# Patient Record
Sex: Male | Born: 1985 | Race: White | Hispanic: Yes | Marital: Single | Smoking: Never smoker
Health system: Southern US, Community
[De-identification: ages and names within clinical notes are randomized; demographics above are authoritative.]

## PROBLEM LIST (undated history)

## (undated) DIAGNOSIS — E079 Disorder of thyroid, unspecified: Secondary | ICD-10-CM

## (undated) HISTORY — DX: Disorder of thyroid, unspecified: E07.9

---

## 2012-02-27 ENCOUNTER — Emergency Department: Payer: Self-pay | Admitting: Emergency Medicine

## 2014-03-22 ENCOUNTER — Ambulatory Visit: Payer: Self-pay | Admitting: Surgery

## 2014-12-17 NOTE — Op Note (Signed)
PATIENT NAME:  Lawrence Taylor, Lawrence Taylor MR#:  [id number] DATE OF BIRTH:  15-May-1986  DATE OF PROCEDURE:  03/23/2014  PREOPERATIVE DIAGNOSIS: Perianal impalement injury.  POSTOPERATIVE DIAGNOSES: Perianal impalement injury, superficial lacerations.   PROCEDURE PERFORMED: Examination under anesthesia of the anorectal canal, digital rectal examination, rigid proctoscopy to 15 cm.   SPECIMENS: None.   FINDINGS: There were superficial lacerations in the anterior midline and on the right anterior lateral portion of the perianal skin. There was no violation of the sphincter. There as normal tone. No evidence of mucosal injury proximal to or involving the dentate line. The prostate did not appear to be high riding or boggy. No soft tissue hematoma or active bleeding was noted.   DESCRIPTION OF PROCEDURE: With informed consent, supine position, general endotracheal anesthesia, the patient was then positioned in dorsal lithotomy. The perineum was sterilely prepped and draped with Betadine. Timeout was observed.   Digital rectal examination demonstrated normal tone. A duckbilled anoscope was introduced. Superficial lacerations were noted measuring approximately 1 to 1.5 cm in length. They are as noted above. There was no violation of the sphincter mechanism. Rigid proctoscopy was then performed after manual disimpaction. A rigid proctoscopy was then performed to approximately 20 cm and there was no evidence of violation of the rectal mucosa. At this point, a total of 10 mL of 0.25% plain Marcaine was then infiltrated with a clean needle and after reapplication of Betadine in a circumferential manner to the perianal skin and sphincter mechanism. Peripad was applied and the patient was then subsequently extubated and taken to the recovery room in stable and satisfactory condition by anesthesia services.     ____________________________ Redge GainerMark A. Egbert GaribaldiBird, MD mab:lt D: 03/23/2014 02:47:22 ET T: 03/23/2014  06:15:31 ET JOB#: [id number]  cc: Loraine LericheMark A. Egbert GaribaldiBird, MD, <Dictator> Karisa Nesser A Lyah Millirons MD ELECTRONICALLY SIGNED 03/23/2014 7:09

## 2014-12-17 NOTE — H&P (Signed)
   Subjective/Chief Complaint sat on knife at work by accident.   History of Present Illness 29 y.o male accidentally sat on knife while at workplace.  no blood in urine.   Past History thyroid disease.   Past Med/Surgical Hx:  Anxiety:   Hypothyroidism:   ALLERGIES:  No Known Allergies:    Other Allergies none   HOME MEDICATIONS: Medication Instructions Status  Synthroid  orally  Active   Family and Social History:  Family History Non-Contributory   Social History negative tobacco, negative ETOH   Place of Living Home   Review of Systems:  Subjective/Chief Complaint see above.   Sputum No   Abdominal Pain No   Tolerating Diet last ate and drank at 8 pm.   Medications/Allergies Reviewed Medications/Allergies reviewed   Physical Exam:  GEN no acute distress, thin, afvss   HEENT pale conjunctivae, PERRL   RESP normal resp effort  no use of accessory muscles   CARD regular rate   ABD soft  anterior midline perianal laceration, unable to fully exam secondary to pain., no active bleeding.   NEURO cranial nerves intact   PSYCH A+O to time, place, person, good insight    Assessment/Admission Diagnosis 29 y/o male with impalement injury to anorectum.   Plan exam under anesthesia, rigid proctoscopy, possible laparotomy and colostomy diversion based on significance of injury. also may need urological evaluation if injury extensive.   Electronic Signatures: Natale LayBird, Bria Sparr (MD)  (Signed 29-Jul-15 01:00)  Authored: CHIEF COMPLAINT and HISTORY, PAST MEDICAL/SURGIAL HISTORY, ALLERGIES, Other Allergies, HOME MEDICATIONS, FAMILY AND SOCIAL HISTORY, REVIEW OF SYSTEMS, PHYSICAL EXAM, ASSESSMENT AND PLAN   Last Updated: 29-Jul-15 01:00 by Natale LayBird, Duayne Brideau (MD)

## 2016-10-31 DIAGNOSIS — E039 Hypothyroidism, unspecified: Secondary | ICD-10-CM | POA: Diagnosis not present

## 2016-10-31 DIAGNOSIS — G4700 Insomnia, unspecified: Secondary | ICD-10-CM | POA: Diagnosis not present

## 2016-10-31 DIAGNOSIS — Z9852 Vasectomy status: Secondary | ICD-10-CM | POA: Diagnosis not present

## 2016-11-19 DIAGNOSIS — Z202 Contact with and (suspected) exposure to infections with a predominantly sexual mode of transmission: Secondary | ICD-10-CM | POA: Diagnosis not present

## 2016-11-19 DIAGNOSIS — E039 Hypothyroidism, unspecified: Secondary | ICD-10-CM | POA: Diagnosis not present

## 2016-11-19 DIAGNOSIS — Z0001 Encounter for general adult medical examination with abnormal findings: Secondary | ICD-10-CM | POA: Diagnosis not present

## 2016-11-20 ENCOUNTER — Ambulatory Visit: Payer: Self-pay | Admitting: Urology

## 2017-02-12 ENCOUNTER — Ambulatory Visit: Payer: BLUE CROSS/BLUE SHIELD | Admitting: Urology

## 2017-03-04 DIAGNOSIS — E039 Hypothyroidism, unspecified: Secondary | ICD-10-CM | POA: Diagnosis not present

## 2017-03-04 DIAGNOSIS — G4700 Insomnia, unspecified: Secondary | ICD-10-CM | POA: Diagnosis not present

## 2017-03-04 DIAGNOSIS — F1390 Sedative, hypnotic, or anxiolytic use, unspecified, uncomplicated: Secondary | ICD-10-CM | POA: Diagnosis not present

## 2017-03-09 NOTE — Progress Notes (Signed)
03/10/2017 11:33 AM   Lawrence Taylor 1986/01/21 [id number]  Referring provider: [facility] [id number] [place], [place] [address]  Chief Complaint  Patient presents with  . VAS Consult    new patient referred by [facility]    HPI: [name] is a 31 year old Hispanic male presents today as a referral from Vincent Gros, NP for a vasectomy.  Patient has 2 children, one son and one daughter, who desires no further biological children.  Patient denies any history of chronic prostatitis, epididymitis, orchitis, or other genital pain.  Today, we discussed what the vas deferens is, where it is located, and its function. We reviewed the procedure for vasectomy, it's risks, benefits, alternatives, and likelihood of achieving his goals.   We discussed in detail the procedure, complications, and recovery as well as the need for clearance prior to unprotected intercourse. We discussed that vasectomy does not protect against sexually transmitted diseases. We discussed that this procedure does not result in immediate sterility and that they would need to use other forms of birth control until he has been cleared with a three month negative postvasectomy semen analyses.  I explained that the procedure is considered to be permanent and that attempts at reversal have varying degrees of success. These options include vasectomy reversal, sperm retrieval, and in vitro fertilization; these can be very expensive.   We discussed the chance of postvasectomy pain syndrome which occurs in less than 5% of patients. I explained to the patient that there is no treatment to resolve this chronic pain, and that if it developed I would not be able to help resolve the issue, but that surgery is generally not needed for correction.   I explained there have even been reports of systemic like illness associated with this chronic pain, and that there was no good cure.  I explained that vasectomy it is not a 100% reliable form of birth control, and the risk of pregnancy after vasectomy is approximately 1 in 2000 men who had a negative postvasectomy semen analysis or rare non-motile sperm.  I explained that repeat vasectomy was necessary in less than 1% of vasectomy procedures when employing the type of technique that is performed in the office. I explained that he should refrain from ejaculation for approximately one week following vasectomy. I explained that there are other options for birth control which are permanent and non-permanent; we discussed these.  I explained the rates of surgical complications, such as symptomatic hematoma or infection, are low (1-2%) and vary with the surgeon's experience and criteria used to diagnose the complication.   PMH: No past medical history on file.  Surgical History: History reviewed. No pertinent surgical history.  Home Medications:  Allergies as of 03/10/2017   No Known Allergies     Medication List       Accurate as of 03/10/17 11:33 AM. Always use your most recent med list.          clindamycin 300 MG capsule Commonly known as:  CLEOCIN Take 300 mg by mouth 3 (three) times daily.   clonazePAM 0.5 MG tablet Commonly known as:  KLONOPIN Take 0.5 mg by mouth 2 (two) times daily as needed for anxiety.   diazepam 10 MG tablet Commonly known as:  VALIUM Take 1 tablet (10 mg total) by mouth once.   levothyroxine 50 MCG tablet Commonly known as:  SYNTHROID, LEVOTHROID Take 50 mcg by mouth daily before breakfast.  Allergies: No Known Allergies  Family History: Family History  Problem Relation Age of Onset  . Kidney cancer Neg Hx   . Prostate cancer Neg Hx   . Bladder Cancer Neg Hx     Social History:  reports that he has never smoked. He has never used smokeless tobacco. He reports that he drinks alcohol. He reports that he does not use drugs.  ROS: UROLOGY Frequent Urination?: No Hard  to postpone urination?: No Burning/pain with urination?: No Get up at night to urinate?: No Leakage of urine?: No Urine stream starts and stops?: No Trouble starting stream?: No Do you have to strain to urinate?: No Blood in urine?: No Urinary tract infection?: No Sexually transmitted disease?: No Injury to kidneys or bladder?: No Painful intercourse?: No Weak stream?: No Erection problems?: No Penile pain?: No  Gastrointestinal Nausea?: No Vomiting?: No Indigestion/heartburn?: No Diarrhea?: Yes Constipation?: No  Constitutional Fever: No Night sweats?: No Weight loss?: No Fatigue?: No  Skin Skin rash/lesions?: No Itching?: No  Eyes Blurred vision?: No Double vision?: No  Ears/Nose/Throat Sore throat?: No Sinus problems?: No  Hematologic/Lymphatic Swollen glands?: No Easy bruising?: No  Cardiovascular Leg swelling?: No Chest pain?: No  Respiratory Cough?: No Shortness of breath?: No  Endocrine Excessive thirst?: No  Musculoskeletal Back pain?: Yes Joint pain?: No  Neurological Headaches?: No Dizziness?: No  Psychologic Depression?: No Anxiety?: Yes  Physical Exam: BP 124/76   Pulse (!) 51   Ht 5\' 9"  (1.753 m)   Wt 178 lb 8 oz (81 kg)   BMI 26.36 kg/m   Constitutional: Well nourished. Alert and oriented, No acute distress. HEENT: [place] AT, moist mucus membranes. Trachea midline, no masses. Cardiovascular: No clubbing, cyanosis, or edema. Respiratory: Normal respiratory effort, no increased work of breathing. GI: Abdomen is soft, non tender, non distended, no abdominal masses. Liver and spleen not palpable.  No hernias appreciated.  Stool sample for occult testing is not indicated.   GU: No CVA tenderness.  No bladder fullness or masses.  Patient with uncircumcised phallus.  Foreskin easily retracted.  Urethral meatus is patent.  No penile discharge. No penile lesions or rashes. Scrotum without lesions, cysts, rashes and/or edema.   Testicles are located scrotally bilaterally. No masses are appreciated in the testicles. Left and right epididymis are normal. Rectal: Deferred.   Skin: No rashes, bruises or suspicious lesions. Lymph: No cervical or inguinal adenopathy. Neurologic: Grossly intact, no focal deficits, moving all 4 extremities. Psychiatric: Normal mood and affect.   Assessment & Plan:     1. Vasectomy consult:  Patient has read and signed the consent.  He is given the pre-op vasectomy instruction sheet.  He is prescribed Valium 10 mg and instructed to take it 30 minutes prior to his vasectomy appointment.  He is to have a driver.  I reemphasized to the patient that this is to be considered a permanent form of birth control, that he is to use an alternative form of birth control until we receive the 3 months specimen and it is cleared of sperm and that this will not prevent STI's.  His questions are answered to his satisfaction and he understands the risks and is willing to proceed with the vasectomy.  He will schedule his vasectomy.    I spent 30 minutes in a face-to-face conversation concerning the vasectomy procedure and pre-and post op expectations.  Greater than 50% was spent in counseling & coordination of care with the patient.   Return for vasectomy.  These notes generated with voice recognition software. I apologize for typographical errors.  Zara Council, [facility] [address], [place], [place] [address] [phone]

## 2017-03-10 ENCOUNTER — Ambulatory Visit (INDEPENDENT_AMBULATORY_CARE_PROVIDER_SITE_OTHER): Payer: BLUE CROSS/BLUE SHIELD | Admitting: Urology

## 2017-03-10 ENCOUNTER — Encounter: Payer: Self-pay | Admitting: Urology

## 2017-03-10 ENCOUNTER — Telehealth: Payer: Self-pay | Admitting: Urology

## 2017-03-10 VITALS — BP 124/76 | HR 51 | Ht 69.0 in | Wt 178.5 lb

## 2017-03-10 DIAGNOSIS — Z3009 Encounter for other general counseling and advice on contraception: Secondary | ICD-10-CM | POA: Diagnosis not present

## 2017-03-10 DIAGNOSIS — F411 Generalized anxiety disorder: Secondary | ICD-10-CM

## 2017-03-10 MED ORDER — DIAZEPAM 10 MG PO TABS: 10.0000 mg | ORAL_TABLET | Freq: Once | ORAL | 0 refills | Status: AC

## 2017-03-10 NOTE — Telephone Encounter (Signed)
Would you send my note to Vincent GrosHeather Boscia, [facility]?

## 2017-03-11 NOTE — Telephone Encounter (Signed)
done

## 2017-07-03 DIAGNOSIS — E039 Hypothyroidism, unspecified: Secondary | ICD-10-CM | POA: Diagnosis not present

## 2017-07-03 DIAGNOSIS — G4700 Insomnia, unspecified: Secondary | ICD-10-CM | POA: Diagnosis not present

## 2017-10-31 ENCOUNTER — Ambulatory Visit (INDEPENDENT_AMBULATORY_CARE_PROVIDER_SITE_OTHER): Payer: BLUE CROSS/BLUE SHIELD | Admitting: Nurse Practitioner

## 2017-10-31 ENCOUNTER — Encounter: Payer: Self-pay | Admitting: Nurse Practitioner

## 2017-10-31 VITALS — BP 114/80 | HR 66 | Resp 16 | Ht 68.0 in | Wt 174.2 lb

## 2017-10-31 DIAGNOSIS — F411 Generalized anxiety disorder: Secondary | ICD-10-CM | POA: Diagnosis not present

## 2017-10-31 DIAGNOSIS — E039 Hypothyroidism, unspecified: Secondary | ICD-10-CM | POA: Diagnosis not present

## 2017-10-31 MED ORDER — LEVOTHYROXINE SODIUM 50 MCG PO TABS: 50.0000 ug | ORAL_TABLET | Freq: Every day | ORAL | 3 refills | Status: DC

## 2017-10-31 MED ORDER — CLONAZEPAM 0.5 MG PO TABS: 0.5000 mg | ORAL_TABLET | Freq: Every day | ORAL | 3 refills | Status: DC

## 2017-10-31 NOTE — Progress Notes (Signed)
[facility] [address] [place], [place] [address]  Internal MEDICINE  Office Visit Note  Patient Name: Lawrence Taylor  [id number]  [id number]  Date of Service: 11/22/2017  No chief complaint on file.   The patient is here for routine follow up exam. He continues to use clonazepam 0.5mg  at bedtime to help him sleep. He is able to  Take this medication at bedtime and it enables him to fall asleep and stay sleep without issue. He needs to have this medication filled today.  He has no new concerns or complaints today.    Pt is here for routine follow up.    Current Medication: Outpatient Encounter Medications as of 10/31/2017  Medication Sig  . clonazePAM (KLONOPIN) 0.5 MG tablet Take 1 tablet (0.5 mg total) by mouth at bedtime.  Marland Kitchen levothyroxine (SYNTHROID, LEVOTHROID) 50 MCG tablet Take 1 tablet (50 mcg total) by mouth daily before breakfast.  . [DISCONTINUED] clonazePAM (KLONOPIN) 0.5 MG tablet Take 0.5 mg by mouth 2 (two) times daily as needed for anxiety.  . [DISCONTINUED] levothyroxine (SYNTHROID, LEVOTHROID) 50 MCG tablet Take 50 mcg by mouth daily before breakfast.  . [DISCONTINUED] clindamycin (CLEOCIN) 300 MG capsule Take 300 mg by mouth 3 (three) times daily.   No facility-administered encounter medications on file as of 10/31/2017.     Surgical History: No past surgical history on file.  Medical History: Past Medical History:  Diagnosis Date  . Thyroid disease     Family History: Family History  Problem Relation Age of Onset  . Kidney cancer Neg Hx   . Prostate cancer Neg Hx   . Bladder Cancer Neg Hx     Social History   Socioeconomic History  . Marital status: Single    Spouse name: Not on file  . Number of children: Not on file  . Years of education: Not on file  . Highest education level: Not on file  Occupational History  . Not on file  Social Needs  . Financial resource strain: Not on file  . Food insecurity:    Worry:  Not on file    Inability: Not on file  . Transportation needs:    Medical: Not on file    Non-medical: Not on file  Tobacco Use  . Smoking status: Never Smoker  . Smokeless tobacco: Never Used  Substance and Sexual Activity  . Alcohol use: Yes    Comment: rare  . Drug use: No  . Sexual activity: Not on file  Lifestyle  . Physical activity:    Days per week: Not on file    Minutes per session: Not on file  . Stress: Not on file  Relationships  . Social connections:    Talks on phone: Not on file    Gets together: Not on file    Attends religious service: Not on file    Active member of club or organization: Not on file    Attends meetings of clubs or organizations: Not on file    Relationship status: Not on file  . Intimate partner violence:    Fear of current or ex partner: Not on file    Emotionally abused: Not on file    Physically abused: Not on file    Forced sexual activity: Not on file  Other Topics Concern  . Not on file  Social History Narrative  . Not on file      Review of Systems  Constitutional: Negative for activity change, chills, fatigue  and unexpected weight change.  HENT: Negative for congestion, postnasal drip, rhinorrhea, sneezing and sore throat.   Eyes: Negative.  Negative for redness.  Respiratory: Negative for cough, chest tightness, shortness of breath and wheezing.   Cardiovascular: Negative for chest pain and palpitations.  Gastrointestinal: Negative for abdominal pain, constipation, diarrhea, nausea and vomiting.  Endocrine:       Thyroid levels have been well controlled.   Genitourinary: Negative.  Negative for dysuria and frequency.  Musculoskeletal: Negative for arthralgias, back pain, joint swelling and neck pain.  Skin: Negative for rash.  Allergic/Immunologic: Negative for environmental allergies.  Neurological: Negative for tremors, numbness and headaches.  Hematological: Negative for adenopathy. Does not bruise/bleed easily.   Psychiatric/Behavioral: Positive for sleep disturbance. Negative for behavioral problems (Depression) and suicidal ideas. The patient is nervous/anxious.     Vital Signs: BP 114/80   Pulse 66   Resp 16   Ht 5\' 8"  (1.727 m)   Wt 174 lb 3.2 oz (79 kg)   SpO2 98%   BMI 26.49 kg/m    Physical Exam  Constitutional: He is oriented to person, place, and time. He appears well-developed and well-nourished. No distress.  HENT:  Head: Normocephalic and atraumatic.  Mouth/Throat: Oropharynx is clear and moist. No oropharyngeal exudate.  Eyes: Pupils are equal, round, and reactive to light. EOM are normal.  Neck: Normal range of motion. Neck supple. No JVD present. No tracheal deviation present. No thyromegaly present.  Cardiovascular: Normal rate, regular rhythm and normal heart sounds. Exam reveals no gallop and no friction rub.  No murmur heard. Pulmonary/Chest: Effort normal and breath sounds normal. No respiratory distress. He has no wheezes. He has no rales. He exhibits no tenderness.  Abdominal: Soft. Bowel sounds are normal. There is no tenderness.  Musculoskeletal: Normal range of motion.  Lymphadenopathy:    He has no cervical adenopathy.  Neurological: He is alert and oriented to person, place, and time. No cranial nerve deficit.  Skin: Skin is warm and dry. He is not diaphoretic.  Psychiatric: He has a normal mood and affect. His behavior is normal. Judgment and thought content normal.  Nursing note and vitals reviewed.  Assessment/Plan: 1. Acquired hypothyroidism Thyroid panel within normal limits. contineu levothyroxine as prescribed.  - levothyroxine (SYNTHROID, LEVOTHROID) 50 MCG tablet; Take 1 tablet (50 mcg total) by mouth daily before breakfast.  Dispense: 30 tablet; Refill: 3  2. Generalized anxiety disorder May continue clonazepam 0.5mg  at night as needed for insomnia. New prescription provided to pharmacy.  - clonazePAM (KLONOPIN) 0.5 MG tablet; Take 1 tablet (0.5  mg total) by mouth at bedtime.  Dispense: 30 tablet; Refill: 3  General Counseling: Dat verbalizes understanding of the findings of todays visit and agrees with plan of treatment. I have discussed any further diagnostic evaluation that may be needed or ordered today. We also reviewed his medications today. he has been encouraged to call the office with any questions or concerns that should arise related to todays visit.  This patient was seen by Vincent Gros, FNP- C in Collaboration with Dr Lyndon Code as a part of collaborative care agreement   Meds ordered this encounter  Medications  . clonazePAM (KLONOPIN) 0.5 MG tablet    Sig: Take 1 tablet (0.5 mg total) by mouth at bedtime.    Dispense:  30 tablet    Refill:  3    Order Specific Question:   Supervising Provider    Answer:   Lyndon Code [[id number]]  .  levothyroxine (SYNTHROID, LEVOTHROID) 50 MCG tablet    Sig: Take 1 tablet (50 mcg total) by mouth daily before breakfast.    Dispense:  30 tablet    Refill:  3    Order Specific Question:   Supervising Provider    Answer:   Lyndon CodeKHAN, FOZIA M [[id number]]    Time spent: 6115 Minutes     Dr Lyndon CodeFozia M Khan Internal medicine

## 2017-11-22 DIAGNOSIS — F411 Generalized anxiety disorder: Secondary | ICD-10-CM | POA: Insufficient documentation

## 2017-11-22 DIAGNOSIS — E039 Hypothyroidism, unspecified: Secondary | ICD-10-CM | POA: Insufficient documentation

## 2018-01-21 ENCOUNTER — Emergency Department
Admission: EM | Admit: 2018-01-21 | Discharge: 2018-01-21 | Disposition: A | Discharge status: Home / Self Care | Payer: BLUE CROSS/BLUE SHIELD | Attending: Emergency Medicine | Admitting: Emergency Medicine

## 2018-01-21 ENCOUNTER — Other Ambulatory Visit: Payer: Self-pay

## 2018-01-21 ENCOUNTER — Encounter: Payer: Self-pay | Admitting: Emergency Medicine

## 2018-01-21 DIAGNOSIS — K219 Gastro-esophageal reflux disease without esophagitis: Secondary | ICD-10-CM

## 2018-01-21 DIAGNOSIS — Z79899 Other long term (current) drug therapy: Secondary | ICD-10-CM | POA: Diagnosis not present

## 2018-01-21 DIAGNOSIS — R066 Hiccough: Secondary | ICD-10-CM

## 2018-01-21 MED ORDER — GI COCKTAIL ~~LOC~~
30.0000 mL | Freq: Once | ORAL | Status: AC
  Administered 2018-01-21: 30 mL via ORAL
  Filled 2018-01-21: qty 30

## 2018-01-21 MED ORDER — OMEPRAZOLE 10 MG PO CPDR: 10.0000 mg | DELAYED_RELEASE_CAPSULE | Freq: Every day | ORAL | 0 refills | Status: DC

## 2018-01-21 NOTE — ED Provider Notes (Signed)
[facility] Emergency Department Provider Note  ____________________________________________  Time seen: Approximately 11:23 PM  I have reviewed the triage vital signs and the nursing notes.   HISTORY  Chief Complaint No chief complaint on file.    HPI Lawrence Taylor is a 32 y.o. male who presents the emergency department complaining of ongoing hiccups.  Patient reports that he typically eats spicy foods with no difficulty, however he ate a large burrito with significant spicy components.  Patient reports since then he has had GERD-like symptoms with bulging, burning and esophageal tract.  Patient is also had intermittent hiccups lasting for 24 hours.  Patient denies any difficulty breathing or swallowing.  No shortness of breath or chest pain.  No history of GERD.  Patient is tried multiple home remedies without successful relief of hiccups or GERD symptoms.  No other complaints.  Past Medical History:  Diagnosis Date  . Thyroid disease     Patient Active Problem List   Diagnosis Date Noted  . Acquired hypothyroidism 11/22/2017  . Generalized anxiety disorder 11/22/2017    History reviewed. No pertinent surgical history.  Prior to Admission medications   Medication Sig Start Date End Date Taking? Authorizing Provider  clonazePAM (KLONOPIN) 0.5 MG tablet Take 1 tablet (0.5 mg total) by mouth at bedtime. 10/31/17   Carlean Jews, NP  levothyroxine (SYNTHROID, LEVOTHROID) 50 MCG tablet Take 1 tablet (50 mcg total) by mouth daily before breakfast. 10/31/17   Carlean Jews, NP  omeprazole (PRILOSEC) 10 MG capsule Take 1 capsule (10 mg total) by mouth daily. 01/21/18   Morine Kohlman, Delorise Royals, PA-C    Allergies Patient has no known allergies.  Family History  Problem Relation Age of Onset  . Kidney cancer Neg Hx   . Prostate cancer Neg Hx   . Bladder Cancer Neg Hx     Social History Social History   Tobacco Use  . Smoking status:  Never Smoker  . Smokeless tobacco: Never Used  Substance Use Topics  . Alcohol use: Yes    Comment: rare  . Drug use: No     Review of Systems  Constitutional: No fever/chills Eyes: No visual changes. No discharge ENT: No upper respiratory complaints. Cardiovascular: no chest pain. Respiratory: no cough. No SOB. Gastrointestinal: Reflux symptoms with hiccuping.  No abdominal pain.  No nausea, no vomiting.  No diarrhea.  No constipation. Musculoskeletal: Negative for musculoskeletal pain. Skin: Negative for rash, abrasions, lacerations, ecchymosis. Neurological: Negative for headaches, focal weakness or numbness. 10-point ROS otherwise negative.  ____________________________________________   PHYSICAL EXAM:  VITAL SIGNS: ED Triage Vitals [01/21/18 2203]  Enc Vitals Group     BP (!) 142/80     Pulse Rate 83     Resp 17     Temp 98.4 F (36.9 C)     Temp Source Oral     SpO2 97 %     Weight 175 lb (79.4 kg)     Height  (1.727 m)     Head Circumference      Peak Flow      Pain Score 0     Pain Loc      Pain Edu?      Excl. in GC?      Constitutional: Alert and oriented. Well appearing and in no acute distress. Eyes: Conjunctivae are normal. PERRL. EOMI. Head: Atraumatic. ENT:      Ears:       Nose: No congestion/rhinnorhea.  Mouth/Throat: Mucous membranes are moist.  Pharynx is nonerythematous and nonedematous. Neck: No stridor.    Cardiovascular: Normal rate, regular rhythm. Normal S1 and S2.  Good peripheral circulation. Respiratory: Normal respiratory effort without tachypnea or retractions. Lungs CTAB. Good air entry to the bases with no decreased or absent breath sounds. Gastrointestinal: Ongoing hiccuping.  Bowel sounds 4 quadrants. Soft and nontender to palpation. No guarding or rigidity. No palpable masses. No distention. No CVA tenderness. Musculoskeletal: Full range of motion to all extremities. No gross deformities appreciated. Neurologic:   Normal speech and language. No gross focal neurologic deficits are appreciated.  Skin:  Skin is warm, dry and intact. No rash noted. Psychiatric: Mood and affect are normal. Speech and behavior are normal. Patient exhibits appropriate insight and judgement.   ____________________________________________   LABS (all labs ordered are listed, but only abnormal results are displayed)  Labs Reviewed - No data to display ____________________________________________  EKG   ____________________________________________  RADIOLOGY   No results found.  ____________________________________________    PROCEDURES  Procedure(s) performed:    Procedures    Medications  gi cocktail (Maalox,Lidocaine,Donnatal) (30 mLs Oral Given 01/21/18 2338)     ____________________________________________   INITIAL IMPRESSION / ASSESSMENT AND PLAN / ED COURSE  Pertinent labs & imaging results that were available during my care of the patient were reviewed by me and considered in my medical decision making (see chart for details).  Review of the [place] CSRS was performed in accordance of the [facility] prior to dispensing any controlled drugs.     Patient's diagnosis is consistent with hiccups with gastric reflux.  Patient presents with 24-hour history of intermittent hiccups with GERD symptoms.  Patient does not have a history of GERD.  Patient ate a spicy burrito prior to onset of symptoms.  Exam was reassuring with no acute findings.  I feel that symptoms are most likely contributory to spicy foods causing reflux and hiccups.  Patient is treated with GI cocktail for symptom relief.  No indication for labs or imaging at this time. Patient will be discharged home with prescriptions for omeprazole. Patient is to follow up with primary care as needed or otherwise directed. Patient is given ED precautions to return to the ED for any worsening or new  symptoms.     ____________________________________________  FINAL CLINICAL IMPRESSION(S) / ED DIAGNOSES  Final diagnoses:  Hiccups  Gastric reflux      NEW MEDICATIONS STARTED DURING THIS VISIT:  ED Discharge Orders        Ordered    omeprazole (PRILOSEC) 10 MG capsule  Daily     01/21/18 2333          This chart was dictated using voice recognition software/Dragon. Despite best efforts to proofread, errors can occur which can change the meaning. Any change was purely unintentional.    Racheal Patches, PA-C 01/22/18 0050    Sharyn Creamer, MD 01/23/18 [phone]

## 2018-01-21 NOTE — ED Triage Notes (Signed)
Patient states that hiccups started at 10pm yesterday and wasn't able to sleep last night. Patient states when he has the hiccups he also has GERD like symptoms. Patient has no hiccups or heartburn at the moment.

## 2018-01-25 DIAGNOSIS — R112 Nausea with vomiting, unspecified: Secondary | ICD-10-CM | POA: Diagnosis not present

## 2018-01-25 DIAGNOSIS — R066 Hiccough: Secondary | ICD-10-CM | POA: Diagnosis not present

## 2018-01-25 DIAGNOSIS — K219 Gastro-esophageal reflux disease without esophagitis: Secondary | ICD-10-CM | POA: Diagnosis not present

## 2018-02-27 ENCOUNTER — Encounter: Payer: Self-pay | Admitting: Nurse Practitioner

## 2018-02-27 ENCOUNTER — Ambulatory Visit: Payer: BLUE CROSS/BLUE SHIELD | Admitting: Nurse Practitioner

## 2018-02-27 ENCOUNTER — Encounter (INDEPENDENT_AMBULATORY_CARE_PROVIDER_SITE_OTHER): Payer: Self-pay

## 2018-02-27 ENCOUNTER — Ambulatory Visit: Payer: Self-pay | Admitting: Nurse Practitioner

## 2018-02-27 VITALS — BP 114/68 | HR 83 | Resp 16 | Ht 68.0 in | Wt 167.0 lb

## 2018-02-27 DIAGNOSIS — Z79899 Other long term (current) drug therapy: Secondary | ICD-10-CM | POA: Diagnosis not present

## 2018-02-27 DIAGNOSIS — R0789 Other chest pain: Secondary | ICD-10-CM

## 2018-02-27 DIAGNOSIS — E039 Hypothyroidism, unspecified: Secondary | ICD-10-CM

## 2018-02-27 DIAGNOSIS — R071 Chest pain on breathing: Secondary | ICD-10-CM

## 2018-02-27 DIAGNOSIS — F411 Generalized anxiety disorder: Secondary | ICD-10-CM

## 2018-02-27 DIAGNOSIS — R079 Chest pain, unspecified: Secondary | ICD-10-CM

## 2018-02-27 LAB — POCT URINE DRUG SCREEN
POC Amphetamine UR: NOT DETECTED
POC BENZODIAZEPINES UR: NOT DETECTED
POC Barbiturate UR: NOT DETECTED
POC COCAINE UR: NOT DETECTED
POC Ecstasy UR: NOT DETECTED
POC Marijuana UR: NOT DETECTED
POC Methadone UR: NOT DETECTED
POC Methamphetamine UR: NOT DETECTED
POC Opiate Ur: NOT DETECTED
POC Oxycodone UR: NOT DETECTED
POC PHENCYCLIDINE UR: NOT DETECTED
POC TRICYCLICS UR: NOT DETECTED

## 2018-02-27 MED ORDER — CLONAZEPAM 0.5 MG PO TABS: 0.5000 mg | ORAL_TABLET | Freq: Every day | ORAL | 3 refills | Status: DC

## 2018-02-27 MED ORDER — LEVOTHYROXINE SODIUM 50 MCG PO TABS: 50.0000 ug | ORAL_TABLET | Freq: Every day | ORAL | 4 refills | Status: DC

## 2018-02-27 NOTE — Progress Notes (Signed)
[facility] [address] [place], [place] [address]  Internal MEDICINE  Office Visit Note  Patient Name: Lawrence Taylor  [id number]  [id number]  Date of Service: 02/27/2018  Chief Complaint  Patient presents with  . Chest Pain    The patient states that he had hiccups and chest pain for about 8 days. He was seen in ER at [facility]. Was given rx for omeprazole. Still had chest pain and hiccups after that.. Did got to ER again at [facility]. Again, no ECG was done. Was given different medicine for reflux. Did better after this visit and chest pain/hiccups eventually resolved. Her still has some reproducible pain in left chest at the third intercostal space. Hurts mre when he bends over at the waist and dangles his arm. No shortness of breath or other worrisome symptoms are reported.    Pt is here for routine follow up.    Current Medication: Outpatient Encounter Medications as of 02/27/2018  Medication Sig  . clonazePAM (KLONOPIN) 0.5 MG tablet Take 1 tablet (0.5 mg total) by mouth at bedtime.  Marland Kitchen levothyroxine (SYNTHROID, LEVOTHROID) 50 MCG tablet Take 1 tablet (50 mcg total) by mouth daily before breakfast.  . omeprazole (PRILOSEC) 10 MG capsule Take 1 capsule (10 mg total) by mouth daily.  . [DISCONTINUED] clonazePAM (KLONOPIN) 0.5 MG tablet Take 1 tablet (0.5 mg total) by mouth at bedtime.  . [DISCONTINUED] levothyroxine (SYNTHROID, LEVOTHROID) 50 MCG tablet Take 1 tablet (50 mcg total) by mouth daily before breakfast.   No facility-administered encounter medications on file as of 02/27/2018.     Surgical History: History reviewed. No pertinent surgical history.  Medical History: Past Medical History:  Diagnosis Date  . Thyroid disease     Family History: Family History  Problem Relation Age of Onset  . Kidney cancer Neg Hx   . Prostate cancer Neg Hx   . Bladder Cancer Neg Hx     Social History   Socioeconomic History  . Marital status:  Single    Spouse name: Not on file  . Number of children: Not on file  . Years of education: Not on file  . Highest education level: Not on file  Occupational History  . Not on file  Social Needs  . Financial resource strain: Not on file  . Food insecurity:    Worry: Not on file    Inability: Not on file  . Transportation needs:    Medical: Not on file    Non-medical: Not on file  Tobacco Use  . Smoking status: Never Smoker  . Smokeless tobacco: Never Used  Substance and Sexual Activity  . Alcohol use: Yes    Comment: rare  . Drug use: No  . Sexual activity: Not on file  Lifestyle  . Physical activity:    Days per week: Not on file    Minutes per session: Not on file  . Stress: Not on file  Relationships  . Social connections:    Talks on phone: Not on file    Gets together: Not on file    Attends religious service: Not on file    Active member of club or organization: Not on file    Attends meetings of clubs or organizations: Not on file    Relationship status: Not on file  . Intimate partner violence:    Fear of current or ex partner: Not on file    Emotionally abused: Not on file    Physically abused:  Not on file    Forced sexual activity: Not on file  Other Topics Concern  . Not on file  Social History Narrative  . Not on file      Review of Systems  Constitutional: Negative for activity change, chills, fatigue and unexpected weight change.  HENT: Negative for congestion, postnasal drip, rhinorrhea, sneezing and sore throat.   Eyes: Negative.  Negative for redness.  Respiratory: Negative for cough, chest tightness, shortness of breath and wheezing.   Cardiovascular: Positive for chest pain. Negative for palpitations.  Gastrointestinal: Negative for abdominal pain, constipation, diarrhea, nausea and vomiting.  Endocrine:       Thyroid levels have been well controlled.   Genitourinary: Negative.  Negative for dysuria and frequency.  Musculoskeletal:  Negative for arthralgias, back pain, joint swelling and neck pain.  Skin: Negative for rash.  Allergic/Immunologic: Negative for environmental allergies.  Neurological: Negative for tremors, numbness and headaches.  Hematological: Negative for adenopathy. Does not bruise/bleed easily.  Psychiatric/Behavioral: Positive for sleep disturbance. Negative for behavioral problems (Depression) and suicidal ideas. The patient is nervous/anxious.     Today's Vitals   02/27/18 1426  BP: 114/68  Pulse: 83  Resp: 16  SpO2: 96%  Weight: 167 lb (75.8 kg)  Height: 5\' 8"  (1.727 m)    Physical Exam  Constitutional: He is oriented to person, place, and time. He appears well-developed and well-nourished. No distress.  HENT:  Head: Normocephalic and atraumatic.  Mouth/Throat: Oropharynx is clear and moist. No oropharyngeal exudate.  Eyes: Pupils are equal, round, and reactive to light. EOM are normal.  Neck: Normal range of motion. Neck supple. No JVD present. No tracheal deviation present. No thyromegaly present.  Cardiovascular: Normal rate, regular rhythm, normal heart sounds and normal pulses. Exam reveals no gallop and no friction rub.  No murmur heard. ECG done today is essentially within normal limits.   Pulmonary/Chest: Effort normal and breath sounds normal. No respiratory distress. He has no wheezes. He has no rales. He exhibits no tenderness.  Abdominal: Soft. Bowel sounds are normal. There is no tenderness.  Musculoskeletal: Normal range of motion.  reproducible chest discomfort at left chest, 4th intercostal space. No swelling or deformity is palpated.   Lymphadenopathy:    He has no cervical adenopathy.  Neurological: He is alert and oriented to person, place, and time. No cranial nerve deficit.  Skin: Skin is warm and dry. He is not diaphoretic.  Psychiatric: His behavior is normal. Judgment and thought content normal. His mood appears anxious.  Nursing note and vitals  reviewed.  Assessment/Plan: 1. Chest pain, unspecified type - EKG 12-Lead today is eesentially within normal limits.   2. Costochondral chest pain Likely cause of chest pain. Advised NSAID treatment as needed and low heat in 15 to 20 minute intervals   3. Acquired hypothyroidism Asked to have 90 day prescription of levothyroxine sent to pharmacy.  - levothyroxine (SYNTHROID, LEVOTHROID) 50 MCG tablet; Take 1 tablet (50 mcg total) by mouth daily before breakfast.  Dispense: 90 tablet; Refill: 4  4. Generalized anxiety disorder Take clonazepam 0.5mg  at bedtime as needed. New rx sent today.  - clonazePAM (KLONOPIN) 0.5 MG tablet; Take 1 tablet (0.5 mg total) by mouth at bedtime.  Dispense: 30 tablet; Refill: 3  5. Encounter for long-term (current) use of medications uds negative for all controlled medication.  - POCT Urine Drug Screen  General Counseling: Lawrence Taylor verbalizes understanding of the findings of todays visit and agrees with plan of treatment. I  have discussed any further diagnostic evaluation that may be needed or ordered today. We also reviewed his medications today. he has been encouraged to call the office with any questions or concerns that should arise related to todays visit.    Counseling:  This patient was seen by Vincent Gros, FNP- C in Collaboration with Dr Lyndon Code as a part of collaborative care agreement   Orders Placed This Encounter  Procedures  . POCT Urine Drug Screen  . EKG 12-Lead    Meds ordered this encounter  Medications  . clonazePAM (KLONOPIN) 0.5 MG tablet    Sig: Take 1 tablet (0.5 mg total) by mouth at bedtime.    Dispense:  30 tablet    Refill:  3    Order Specific Question:   Supervising Provider    Answer:   Lyndon Code [[id number]]  . levothyroxine (SYNTHROID, LEVOTHROID) 50 MCG tablet    Sig: Take 1 tablet (50 mcg total) by mouth daily before breakfast.    Dispense:  90 tablet    Refill:  4    Please fill as ninety day  prescription.    Order Specific Question:   Supervising Provider    Answer:   Lyndon Code [[id number]]    Time spent: 32 Minutes     Dr Lyndon Code Internal medicine

## 2018-07-06 ENCOUNTER — Encounter: Payer: Self-pay | Admitting: Nurse Practitioner

## 2018-07-06 ENCOUNTER — Ambulatory Visit (INDEPENDENT_AMBULATORY_CARE_PROVIDER_SITE_OTHER): Payer: BLUE CROSS/BLUE SHIELD | Admitting: Nurse Practitioner

## 2018-07-06 VITALS — BP 124/78 | HR 75 | Resp 16 | Ht 68.0 in | Wt 172.0 lb

## 2018-07-06 DIAGNOSIS — F411 Generalized anxiety disorder: Secondary | ICD-10-CM | POA: Diagnosis not present

## 2018-07-06 DIAGNOSIS — E039 Hypothyroidism, unspecified: Secondary | ICD-10-CM | POA: Diagnosis not present

## 2018-07-06 MED ORDER — CLONAZEPAM 0.5 MG PO TABS: 0.5000 mg | ORAL_TABLET | Freq: Every day | ORAL | 3 refills | Status: DC

## 2018-07-06 MED ORDER — LEVOTHYROXINE SODIUM 50 MCG PO TABS: 50.0000 ug | ORAL_TABLET | Freq: Every day | ORAL | 3 refills | Status: DC

## 2018-07-06 NOTE — Progress Notes (Signed)
[facility] [address] [place], [place] [address]  Internal MEDICINE  Office Visit Note  Patient Name: Lawrence Taylor  [id number]  [id number]  Date of Service: 07/08/2018  Chief Complaint  Patient presents with  . Medical Management of Chronic Issues    7month follow up    The patient is here for routine follow up exam. He continues to use clonazepam 0.5mg  at bedtime to help him sleep. He is able to take this medication at bedtime and it enables him to fall asleep and stay sleep without issue. He needs to have this medication filled today.  He has no new concerns or complaints today.       Current Medication: Outpatient Encounter Medications as of 07/06/2018  Medication Sig  . clonazePAM (KLONOPIN) 0.5 MG tablet Take 1 tablet (0.5 mg total) by mouth at bedtime.  Marland Kitchen levothyroxine (SYNTHROID, LEVOTHROID) 50 MCG tablet Take 1 tablet (50 mcg total) by mouth daily before breakfast.  . omeprazole (PRILOSEC) 10 MG capsule Take 1 capsule (10 mg total) by mouth daily.  . [DISCONTINUED] clonazePAM (KLONOPIN) 0.5 MG tablet Take 1 tablet (0.5 mg total) by mouth at bedtime.  . [DISCONTINUED] levothyroxine (SYNTHROID, LEVOTHROID) 50 MCG tablet Take 1 tablet (50 mcg total) by mouth daily before breakfast.   No facility-administered encounter medications on file as of 07/06/2018.     Surgical History: History reviewed. No pertinent surgical history.  Medical History: Past Medical History:  Diagnosis Date  . Thyroid disease     Family History: Family History  Problem Relation Age of Onset  . Kidney cancer Neg Hx   . Prostate cancer Neg Hx   . Bladder Cancer Neg Hx     Social History   Socioeconomic History  . Marital status: Single    Spouse name: Not on file  . Number of children: Not on file  . Years of education: Not on file  . Highest education level: Not on file  Occupational History  . Not on file  Social Needs  . Financial resource  strain: Not on file  . Food insecurity:    Worry: Not on file    Inability: Not on file  . Transportation needs:    Medical: Not on file    Non-medical: Not on file  Tobacco Use  . Smoking status: Never Smoker  . Smokeless tobacco: Never Used  Substance and Sexual Activity  . Alcohol use: Yes    Comment: rare  . Drug use: No  . Sexual activity: Not on file  Lifestyle  . Physical activity:    Days per week: Not on file    Minutes per session: Not on file  . Stress: Not on file  Relationships  . Social connections:    Talks on phone: Not on file    Gets together: Not on file    Attends religious service: Not on file    Active member of club or organization: Not on file    Attends meetings of clubs or organizations: Not on file    Relationship status: Not on file  . Intimate partner violence:    Fear of current or ex partner: Not on file    Emotionally abused: Not on file    Physically abused: Not on file    Forced sexual activity: Not on file  Other Topics Concern  . Not on file  Social History Narrative  . Not on file      Review of Systems  Constitutional:  Negative for activity change, chills, fatigue and unexpected weight change.  HENT: Negative for congestion, postnasal drip, rhinorrhea, sneezing and sore throat.   Eyes: Negative.  Negative for redness.  Respiratory: Negative for cough, chest tightness, shortness of breath and wheezing.   Cardiovascular: Negative for chest pain and palpitations.  Gastrointestinal: Negative for abdominal pain, constipation, diarrhea, nausea and vomiting.  Endocrine:       Thyroid levels have been well controlled.   Musculoskeletal: Negative for arthralgias, back pain, joint swelling and neck pain.  Skin: Negative for rash.  Allergic/Immunologic: Negative for environmental allergies.  Neurological: Negative for tremors, numbness and headaches.  Hematological: Negative for adenopathy. Does not bruise/bleed easily.   Psychiatric/Behavioral: Positive for sleep disturbance. Negative for behavioral problems (Depression) and suicidal ideas. The patient is nervous/anxious.    Today's Vitals   07/06/18 1145  BP: 124/78  Pulse: 75  Resp: 16  SpO2: 98%  Weight: 172 lb (78 kg)  Height: 5\' 8"  (1.727 m)    Physical Exam  Constitutional: He is oriented to person, place, and time. He appears well-developed and well-nourished. No distress.  HENT:  Head: Normocephalic and atraumatic.  Mouth/Throat: No oropharyngeal exudate.  Eyes: Pupils are equal, round, and reactive to light. EOM are normal.  Cardiovascular: Normal rate, regular rhythm and normal heart sounds. Exam reveals no gallop and no friction rub.  No murmur heard. Pulmonary/Chest: Effort normal and breath sounds normal. No respiratory distress. He has no wheezes. He has no rales. He exhibits no tenderness.  Abdominal: There is no tenderness.  Musculoskeletal: Normal range of motion.  Neurological: He is alert and oriented to person, place, and time. No cranial nerve deficit.  Skin: Skin is warm and dry. He is not diaphoretic.  Psychiatric: He has a normal mood and affect. His behavior is normal. Judgment and thought content normal.  Nursing note and vitals reviewed.  Assessment/Plan: 1. Acquired hypothyroidism Thyroid panel stable. Continue levothyroxine as prescribed. Check thyroid panel and adjust dose as indicated.  - levothyroxine (SYNTHROID, LEVOTHROID) 50 MCG tablet; Take 1 tablet (50 mcg total) by mouth daily before breakfast.  Dispense: 90 tablet; Refill: 3  2. Generalized anxiety disorder May continue t take klonopin 0.5mg  at bedtime as needed for anxiety/insomnia. New prescription was sent to his pharmacy.  - clonazePAM (KLONOPIN) 0.5 MG tablet; Take 1 tablet (0.5 mg total) by mouth at bedtime.  Dispense: 30 tablet; Refill: 3  General Counseling: [name] verbalizes understanding of the findings of todays visit and agrees with plan of  treatment. I have discussed any further diagnostic evaluation that may be needed or ordered today. We also reviewed his medications today. he has been encouraged to call the office with any questions or concerns that should arise related to todays visit.  Reviewed risks and possible side effects associated with taking opiates, benzodiazepines and other CNS depressants. Combination of these could cause dizziness and drowsiness. Advised patient not to drive or operate machinery when taking these medications, as patient's and other's life can be at risk and will have consequences. Patient verbalized understanding in this matter. Dependence and abuse for these drugs will be monitored closely. A Controlled substance policy and procedure is on file which allows [facility] to order a urine drug screen test at any visit. Patient understands and agrees with the plan  This patient was seen by Vincent Gros FNP Collaboration with Dr Lyndon Code as a part of collaborative care agreement  Meds ordered this encounter  Medications  .  clonazePAM (KLONOPIN) 0.5 MG tablet    Sig: Take 1 tablet (0.5 mg total) by mouth at bedtime.    Dispense:  30 tablet    Refill:  3    Order Specific Question:   Supervising Provider    Answer:   Lyndon Code [[id number]]  . levothyroxine (SYNTHROID, LEVOTHROID) 50 MCG tablet    Sig: Take 1 tablet (50 mcg total) by mouth daily before breakfast.    Dispense:  90 tablet    Refill:  3    Please fill as ninety day prescription.    Order Specific Question:   Supervising Provider    Answer:   Lyndon Code [[id number]]    Time spent: 29 Minutes      Dr Lyndon Code Internal medicine

## 2018-11-05 ENCOUNTER — Other Ambulatory Visit: Payer: Self-pay

## 2018-11-05 ENCOUNTER — Ambulatory Visit: Payer: BLUE CROSS/BLUE SHIELD | Admitting: Nurse Practitioner

## 2018-11-05 ENCOUNTER — Encounter: Payer: Self-pay | Admitting: Nurse Practitioner

## 2018-11-05 VITALS — BP 111/69 | HR 72 | Resp 16 | Ht 68.0 in | Wt 171.2 lb

## 2018-11-05 DIAGNOSIS — E039 Hypothyroidism, unspecified: Secondary | ICD-10-CM | POA: Diagnosis not present

## 2018-11-05 DIAGNOSIS — F411 Generalized anxiety disorder: Secondary | ICD-10-CM

## 2018-11-05 MED ORDER — CLONAZEPAM 0.5 MG PO TABS: 0.5000 mg | ORAL_TABLET | Freq: Every day | ORAL | 3 refills | Status: DC

## 2018-11-05 NOTE — Progress Notes (Signed)
[facility] [address] [place], [place] [address]  Internal MEDICINE  Office Visit Note  Patient Name: Lawrence Taylor  [id number]  [id number]  Date of Service: 12/13/2018  Chief Complaint  Patient presents with  . Medical Management of Chronic Issues    4 month follow up, medication refill  . Hypothyroidism    The patient is here for routine follow up exam. He continues to use clonazepam 0.5mg  at bedtime to help him sleep. He is able to take this medication at bedtime and it enables him to fall asleep and stay sleep without issue. He needs to have this medication filled today.  He has no new concerns or complaints today.       Current Medication: Outpatient Encounter Medications as of 11/05/2018  Medication Sig  . clonazePAM (KLONOPIN) 0.5 MG tablet Take 1 tablet (0.5 mg total) by mouth at bedtime.  Marland Kitchen levothyroxine (SYNTHROID, LEVOTHROID) 50 MCG tablet Take 1 tablet (50 mcg total) by mouth daily before breakfast.  . omeprazole (PRILOSEC) 10 MG capsule Take 1 capsule (10 mg total) by mouth daily.  . [DISCONTINUED] clonazePAM (KLONOPIN) 0.5 MG tablet Take 1 tablet (0.5 mg total) by mouth at bedtime.   No facility-administered encounter medications on file as of 11/05/2018.     Surgical History: History reviewed. No pertinent surgical history.  Medical History: Past Medical History:  Diagnosis Date  . Thyroid disease     Family History: Family History  Problem Relation Age of Onset  . Kidney cancer Neg Hx   . Prostate cancer Neg Hx   . Bladder Cancer Neg Hx     Social History   Socioeconomic History  . Marital status: Single    Spouse name: Not on file  . Number of children: Not on file  . Years of education: Not on file  . Highest education level: Not on file  Occupational History  . Not on file  Social Needs  . Financial resource strain: Not on file  . Food insecurity:    Worry: Not on file    Inability: Not on file  . Transportation  needs:    Medical: Not on file    Non-medical: Not on file  Tobacco Use  . Smoking status: Never Smoker  . Smokeless tobacco: Never Used  Substance and Sexual Activity  . Alcohol use: Yes    Comment: once every 1-75yrs  . Drug use: No  . Sexual activity: Not on file  Lifestyle  . Physical activity:    Days per week: Not on file    Minutes per session: Not on file  . Stress: Not on file  Relationships  . Social connections:    Talks on phone: Not on file    Gets together: Not on file    Attends religious service: Not on file    Active member of club or organization: Not on file    Attends meetings of clubs or organizations: Not on file    Relationship status: Not on file  . Intimate partner violence:    Fear of current or ex partner: Not on file    Emotionally abused: Not on file    Physically abused: Not on file    Forced sexual activity: Not on file  Other Topics Concern  . Not on file  Social History Narrative  . Not on file      Review of Systems  Constitutional: Negative for activity change, chills, fatigue and unexpected weight change.  HENT:  Negative for congestion, postnasal drip, rhinorrhea, sneezing and sore throat.   Respiratory: Negative for cough, chest tightness, shortness of breath and wheezing.   Cardiovascular: Negative for chest pain and palpitations.  Gastrointestinal: Negative for abdominal pain, constipation, diarrhea, nausea and vomiting.  Endocrine:       Thyroid levels have been well controlled.   Musculoskeletal: Negative for arthralgias, back pain, joint swelling and neck pain.  Skin: Negative for rash.  Allergic/Immunologic: Negative for environmental allergies.  Neurological: Negative for tremors, numbness and headaches.  Hematological: Negative for adenopathy. Does not bruise/bleed easily.  Psychiatric/Behavioral: Positive for sleep disturbance. Negative for behavioral problems (Depression) and suicidal ideas. The patient is  nervous/anxious.     Today's Vitals   11/05/18 1157  BP: 111/69  Pulse: 72  Resp: 16  SpO2: 99%  Weight: 171 lb 3.2 oz (77.7 kg)  Height: 5\' 8"  (1.727 m)   Body mass index is 26.03 kg/m.  Physical Exam Vitals signs and nursing note reviewed.  Constitutional:      General: He is not in acute distress.    Appearance: Normal appearance. He is well-developed. He is not diaphoretic.  HENT:     Head: Normocephalic and atraumatic.     Mouth/Throat:     Pharynx: No oropharyngeal exudate.  Eyes:     Pupils: Pupils are equal, round, and reactive to light.  Cardiovascular:     Rate and Rhythm: Normal rate and regular rhythm.     Heart sounds: Normal heart sounds. No murmur. No friction rub. No gallop.   Pulmonary:     Effort: Pulmonary effort is normal. No respiratory distress.     Breath sounds: Normal breath sounds. No wheezing or rales.  Chest:     Chest wall: No tenderness.  Abdominal:     Tenderness: There is no abdominal tenderness.  Musculoskeletal: Normal range of motion.  Skin:    General: Skin is warm and dry.  Neurological:     Mental Status: He is alert and oriented to person, place, and time.     Cranial Nerves: No cranial nerve deficit.  Psychiatric:        Behavior: Behavior normal.        Thought Content: Thought content normal.        Judgment: Judgment normal.   Assessment/Plan: 1. Acquired hypothyroidism Thyroid panel stable. Continue levothyroxine as prescribed   2. Generalized anxiety disorder May continue Klonopin 0.5mg  at bedtime as needed for insomnia/anxiety. New prescriptions sent to his pharmacy  - clonazePAM (KLONOPIN) 0.5 MG tablet; Take 1 tablet (0.5 mg total) by mouth at bedtime.  Dispense: 30 tablet; Refill: 3  General Counseling: Dravin verbalizes understanding of the findings of todays visit and agrees with plan of treatment. I have discussed any further diagnostic evaluation that may be needed or ordered today. We also reviewed his  medications today. he has been encouraged to call the office with any questions or concerns that should arise related to todays visit.  This patient was seen by Vincent Gros FNP Collaboration with Dr Lyndon Code as a part of collaborative care agreement  Meds ordered this encounter  Medications  . clonazePAM (KLONOPIN) 0.5 MG tablet    Sig: Take 1 tablet (0.5 mg total) by mouth at bedtime.    Dispense:  30 tablet    Refill:  3    Order Specific Question:   Supervising Provider    Answer:   Lyndon Code [[id number]]    Time spent: 54  Minutes      Dr Lavera Guise Internal medicine

## 2019-03-05 ENCOUNTER — Ambulatory Visit (INDEPENDENT_AMBULATORY_CARE_PROVIDER_SITE_OTHER): Payer: BC Managed Care – PPO | Admitting: Nurse Practitioner

## 2019-03-05 ENCOUNTER — Other Ambulatory Visit: Payer: Self-pay

## 2019-03-05 ENCOUNTER — Encounter: Payer: Self-pay | Admitting: Nurse Practitioner

## 2019-03-05 VITALS — BP 124/77 | HR 89 | Temp 97.9°F | Resp 16 | Ht 68.0 in | Wt 175.0 lb

## 2019-03-05 DIAGNOSIS — F411 Generalized anxiety disorder: Secondary | ICD-10-CM | POA: Diagnosis not present

## 2019-03-05 DIAGNOSIS — E039 Hypothyroidism, unspecified: Secondary | ICD-10-CM | POA: Diagnosis not present

## 2019-03-05 MED ORDER — CLONAZEPAM 0.5 MG PO TABS: 0.5000 mg | ORAL_TABLET | Freq: Every day | ORAL | 3 refills | Status: DC

## 2019-03-05 NOTE — Progress Notes (Signed)
[facility] [place], [place] [address]  Internal MEDICINE  Office Visit Note  Patient Name: Lawrence Taylor  [id number]  [id number]  Date of Service: 03/10/2019  Chief Complaint  Patient presents with  . Medical Management of Chronic Issues    4 month follow up, medication refills  . Hypothyroidism    The patient is here for routine follow up exam. He continues to use clonazepam 0.5mg  at bedtime to help him sleep. He is able to take this medication at bedtime and it enables him to fall asleep and stay sleep without issue. He needs to have this medication filled today.  He has no new concerns or complaints today. Continues to take levothyroxine 39mcg daily.       Current Medication: Outpatient Encounter Medications as of 03/05/2019  Medication Sig  . clonazePAM (KLONOPIN) 0.5 MG tablet Take 1 tablet (0.5 mg total) by mouth at bedtime.  Marland Kitchen levothyroxine (SYNTHROID, LEVOTHROID) 50 MCG tablet Take 1 tablet (50 mcg total) by mouth daily before breakfast.  . omeprazole (PRILOSEC) 10 MG capsule Take 1 capsule (10 mg total) by mouth daily.  . [DISCONTINUED] clonazePAM (KLONOPIN) 0.5 MG tablet Take 1 tablet (0.5 mg total) by mouth at bedtime.   No facility-administered encounter medications on file as of 03/05/2019.     Surgical History: History reviewed. No pertinent surgical history.  Medical History: Past Medical History:  Diagnosis Date  . Thyroid disease     Family History: Family History  Problem Relation Age of Onset  . Kidney cancer Neg Hx   . Prostate cancer Neg Hx   . Bladder Cancer Neg Hx     Social History   Socioeconomic History  . Marital status: Single    Spouse name: Not on file  . Number of children: Not on file  . Years of education: Not on file  . Highest education level: Not on file  Occupational History  . Not on file  Social Needs  . Financial resource strain: Not on file  . Food insecurity    Worry: Not on file     Inability: Not on file  . Transportation needs    Medical: Not on file    Non-medical: Not on file  Tobacco Use  . Smoking status: Never Smoker  . Smokeless tobacco: Never Used  Substance and Sexual Activity  . Alcohol use: Yes    Comment: once every 1-63yrs  . Drug use: No  . Sexual activity: Not on file  Lifestyle  . Physical activity    Days per week: Not on file    Minutes per session: Not on file  . Stress: Not on file  Relationships  . Social Herbalist on phone: Not on file    Gets together: Not on file    Attends religious service: Not on file    Active member of club or organization: Not on file    Attends meetings of clubs or organizations: Not on file    Relationship status: Not on file  . Intimate partner violence    Fear of current or ex partner: Not on file    Emotionally abused: Not on file    Physically abused: Not on file    Forced sexual activity: Not on file  Other Topics Concern  . Not on file  Social History Narrative  . Not on file      Review of Systems  Constitutional: Negative for activity change, chills, fatigue  and unexpected weight change.  HENT: Negative for congestion, postnasal drip, rhinorrhea, sneezing and sore throat.   Respiratory: Negative for cough, chest tightness, shortness of breath and wheezing.   Cardiovascular: Negative for chest pain and palpitations.  Gastrointestinal: Negative for abdominal pain, constipation, diarrhea, nausea and vomiting.  Endocrine: Negative for cold intolerance, heat intolerance, polydipsia and polyuria.       Thyroid levels have been well controlled.   Musculoskeletal: Negative for arthralgias, back pain, joint swelling and neck pain.  Skin: Negative for rash.  Allergic/Immunologic: Negative for environmental allergies.  Neurological: Negative for tremors, numbness and headaches.  Hematological: Negative for adenopathy. Does not bruise/bleed easily.  Psychiatric/Behavioral: Positive for  sleep disturbance. Negative for behavioral problems (Depression) and suicidal ideas. The patient is nervous/anxious.    Today's Vitals   03/05/19 1149  BP: 124/77  Pulse: 89  Resp: 16  Temp: 97.9 F (36.6 C)  SpO2: 98%  Weight: 175 lb (79.4 kg)  Height: 5\' 8"  (1.727 m)   Body mass index is 26.61 kg/m.   Physical Exam Vitals signs and nursing note reviewed.  Constitutional:      General: He is not in acute distress.    Appearance: Normal appearance. He is well-developed. He is not diaphoretic.  HENT:     Head: Normocephalic and atraumatic.     Mouth/Throat:     Pharynx: No oropharyngeal exudate.  Eyes:     Pupils: Pupils are equal, round, and reactive to light.  Cardiovascular:     Rate and Rhythm: Normal rate and regular rhythm.     Heart sounds: Normal heart sounds. No murmur. No friction rub. No gallop.   Pulmonary:     Effort: Pulmonary effort is normal. No respiratory distress.     Breath sounds: Normal breath sounds. No wheezing or rales.  Chest:     Chest wall: No tenderness.  Abdominal:     Tenderness: There is no abdominal tenderness.  Musculoskeletal: Normal range of motion.  Skin:    General: Skin is warm and dry.  Neurological:     Mental Status: He is alert and oriented to person, place, and time.     Cranial Nerves: No cranial nerve deficit.  Psychiatric:        Behavior: Behavior normal.        Thought Content: Thought content normal.        Judgment: Judgment normal.    Assessment/Plan:  1. Acquired hypothyroidism Stable. Continue levothyroxine as prescribed   2. Generalized anxiety disorder May continue clonazepam 0.5mg  at bedtime as needed for anxiety/insomnia. New prescription provided today.  - clonazePAM (KLONOPIN) 0.5 MG tablet; Take 1 tablet (0.5 mg total) by mouth at bedtime.  Dispense: 30 tablet; Refill: 3  General Counseling: Ferd verbalizes understanding of the findings of todays visit and agrees with plan of treatment. I have  discussed any further diagnostic evaluation that may be needed or ordered today. We also reviewed his medications today. he has been encouraged to call the office with any questions or concerns that should arise related to todays visit.  This patient was seen by Vincent GrosHeather Tafari Humiston FNP Collaboration with Dr Lyndon CodeFozia M Khan as a part of collaborative care agreement  Meds ordered this encounter  Medications  . clonazePAM (KLONOPIN) 0.5 MG tablet    Sig: Take 1 tablet (0.5 mg total) by mouth at bedtime.    Dispense:  30 tablet    Refill:  3    Order Specific Question:   Supervising  Provider    Answer:   Lyndon CodeKHAN, FOZIA M [[id number]]    Time spent: 3115 Minutes      Dr Lyndon CodeFozia M Khan Internal medicine

## 2019-07-05 ENCOUNTER — Telehealth: Payer: Self-pay

## 2019-07-05 ENCOUNTER — Other Ambulatory Visit: Payer: Self-pay | Admitting: Nurse Practitioner

## 2019-07-05 DIAGNOSIS — F411 Generalized anxiety disorder: Secondary | ICD-10-CM

## 2019-07-05 MED ORDER — CLONAZEPAM 0.5 MG PO TABS: 0.5000 mg | ORAL_TABLET | Freq: Every day | ORAL | 0 refills | Status: DC

## 2019-07-05 NOTE — Telephone Encounter (Signed)
Three tablets of clonazepam sent to his pharmacy.  

## 2019-07-05 NOTE — Telephone Encounter (Signed)
Send message 

## 2019-07-05 NOTE — Progress Notes (Signed)
Three tablets of clonazepam sent to his pharmacy.

## 2019-07-05 NOTE — Telephone Encounter (Signed)
Pt advised we send klonopin for 3 day

## 2019-07-07 ENCOUNTER — Telehealth: Payer: Self-pay

## 2019-07-07 NOTE — Telephone Encounter (Signed)
CONFIRMED AND SCREENED FOR 07-09-19 APPOINTMENT.

## 2019-07-09 ENCOUNTER — Other Ambulatory Visit: Payer: Self-pay

## 2019-07-09 ENCOUNTER — Ambulatory Visit: Payer: BC Managed Care – PPO | Admitting: Nurse Practitioner

## 2019-07-09 ENCOUNTER — Encounter: Payer: Self-pay | Admitting: Nurse Practitioner

## 2019-07-09 VITALS — BP 111/79 | HR 72 | Temp 97.8°F | Resp 16 | Ht 68.0 in | Wt 173.6 lb

## 2019-07-09 DIAGNOSIS — Z79899 Other long term (current) drug therapy: Secondary | ICD-10-CM

## 2019-07-09 DIAGNOSIS — F411 Generalized anxiety disorder: Secondary | ICD-10-CM | POA: Diagnosis not present

## 2019-07-09 DIAGNOSIS — E039 Hypothyroidism, unspecified: Secondary | ICD-10-CM | POA: Diagnosis not present

## 2019-07-09 LAB — POCT URINE DRUG SCREEN
POC Amphetamine UR: NOT DETECTED
POC BENZODIAZEPINES UR: NOT DETECTED
POC Barbiturate UR: NOT DETECTED
POC Cocaine UR: NOT DETECTED
POC Ecstasy UR: NOT DETECTED
POC Marijuana UR: NOT DETECTED
POC Methadone UR: NOT DETECTED
POC Methamphetamine UR: NOT DETECTED
POC Opiate Ur: NOT DETECTED
POC Oxycodone UR: NOT DETECTED
POC PHENCYCLIDINE UR: NOT DETECTED
POC TRICYCLICS UR: NOT DETECTED

## 2019-07-09 MED ORDER — LEVOTHYROXINE SODIUM 50 MCG PO TABS: 50.0000 ug | ORAL_TABLET | Freq: Every day | ORAL | 3 refills | Status: DC

## 2019-07-09 MED ORDER — CLONAZEPAM 0.5 MG PO TABS: 0.5000 mg | ORAL_TABLET | Freq: Every day | ORAL | 3 refills | Status: DC

## 2019-07-09 NOTE — Progress Notes (Signed)
[facility] [place], [place] [address]  Internal MEDICINE  Office Visit Note  Patient Name: Lawrence Taylor  [id number]  [id number]  Date of Service: 07/24/2019  Chief Complaint  Patient presents with  . Hypothyroidism  . Toe Injury    left foot     The patient is here for routine follow up exam. He continues to use clonazepam 0.5mg  at bedtime to help him sleep. He is able to take this medication at bedtime and it enables him to fall asleep and stay sleep without issue. He needs to have this medication filled today.  He has no new concerns or complaints today. Continues to take levothyroxine 58mcg daily. He is due to have his thyroid panel checked.       Current Medication: Outpatient Encounter Medications as of 07/09/2019  Medication Sig  . clonazePAM (KLONOPIN) 0.5 MG tablet Take 1 tablet (0.5 mg total) by mouth at bedtime.  Marland Kitchen levothyroxine (SYNTHROID) 50 MCG tablet Take 1 tablet (50 mcg total) by mouth daily before breakfast.  . omeprazole (PRILOSEC) 10 MG capsule Take 1 capsule (10 mg total) by mouth daily.  . [DISCONTINUED] clonazePAM (KLONOPIN) 0.5 MG tablet Take 1 tablet (0.5 mg total) by mouth at bedtime.  . [DISCONTINUED] levothyroxine (SYNTHROID, LEVOTHROID) 50 MCG tablet Take 1 tablet (50 mcg total) by mouth daily before breakfast.   No facility-administered encounter medications on file as of 07/09/2019.     Surgical History: History reviewed. No pertinent surgical history.  Medical History: Past Medical History:  Diagnosis Date  . Thyroid disease     Family History: Family History  Problem Relation Age of Onset  . Kidney cancer Neg Hx   . Prostate cancer Neg Hx   . Bladder Cancer Neg Hx     Social History   Socioeconomic History  . Marital status: Single    Spouse name: Not on file  . Number of children: Not on file  . Years of education: Not on file  . Highest education level: Not on file  Occupational History   . Not on file  Social Needs  . Financial resource strain: Not on file  . Food insecurity    Worry: Not on file    Inability: Not on file  . Transportation needs    Medical: Not on file    Non-medical: Not on file  Tobacco Use  . Smoking status: Never Smoker  . Smokeless tobacco: Never Used  Substance and Sexual Activity  . Alcohol use: Not Currently    Comment: once every 1-57yrs  . Drug use: No  . Sexual activity: Not on file  Lifestyle  . Physical activity    Days per week: Not on file    Minutes per session: Not on file  . Stress: Not on file  Relationships  . Social Herbalist on phone: Not on file    Gets together: Not on file    Attends religious service: Not on file    Active member of club or organization: Not on file    Attends meetings of clubs or organizations: Not on file    Relationship status: Not on file  . Intimate partner violence    Fear of current or ex partner: Not on file    Emotionally abused: Not on file    Physically abused: Not on file    Forced sexual activity: Not on file  Other Topics Concern  . Not on file  Social  History Narrative  . Not on file      Review of Systems  Constitutional: Negative for activity change, chills, fatigue and unexpected weight change.  HENT: Negative for congestion, postnasal drip, rhinorrhea, sneezing and sore throat.   Respiratory: Negative for cough, chest tightness and shortness of breath.   Cardiovascular: Negative for chest pain and palpitations.  Gastrointestinal: Negative for abdominal pain, constipation, diarrhea, nausea and vomiting.  Endocrine: Negative for cold intolerance, heat intolerance, polydipsia and polyuria.  Musculoskeletal: Negative for arthralgias, back pain, joint swelling and neck pain.  Skin: Negative for rash.  Neurological: Negative for dizziness, tremors, numbness and headaches.  Hematological: Negative for adenopathy. Does not bruise/bleed easily.   Psychiatric/Behavioral: Positive for sleep disturbance. Negative for behavioral problems (Depression) and suicidal ideas. The patient is nervous/anxious.     Today's Vitals   07/09/19 1111  BP: 111/79  Pulse: 72  Resp: 16  Temp: 97.8 F (36.6 C)  SpO2: 98%  Weight: 173 lb 9.6 oz (78.7 kg)  Height: 5\' 8"  (1.727 m)   Body mass index is 26.4 kg/m.  Physical Exam Vitals signs and nursing note reviewed.  Constitutional:      General: He is not in acute distress.    Appearance: Normal appearance. He is well-developed. He is not diaphoretic.  HENT:     Head: Normocephalic and atraumatic.     Mouth/Throat:     Pharynx: No oropharyngeal exudate.  Eyes:     Pupils: Pupils are equal, round, and reactive to light.  Neck:     Musculoskeletal: Normal range of motion and neck supple.  Cardiovascular:     Rate and Rhythm: Normal rate and regular rhythm.     Heart sounds: Normal heart sounds. No murmur. No friction rub. No gallop.   Pulmonary:     Effort: Pulmonary effort is normal. No respiratory distress.     Breath sounds: Normal breath sounds. No wheezing or rales.  Chest:     Chest wall: No tenderness.  Abdominal:     Tenderness: There is no abdominal tenderness.  Musculoskeletal: Normal range of motion.  Skin:    General: Skin is warm and dry.  Neurological:     Mental Status: He is alert and oriented to person, place, and time.     Cranial Nerves: No cranial nerve deficit.  Psychiatric:        Mood and Affect: Mood normal.        Behavior: Behavior normal.        Thought Content: Thought content normal.        Judgment: Judgment normal.     Assessment/Plan: 1. Acquired hypothyroidism Recheck thyroid panel and adjust levothyroxine as indicated  - levothyroxine (SYNTHROID) 50 MCG tablet; Take 1 tablet (50 mcg total) by mouth daily before breakfast.  Dispense: 90 tablet; Refill: 3  2. Generalized anxiety disorder May continue clonazepam 0.5mg  at bedtime as needed for  insomnia/anxiety. - clonazePAM (KLONOPIN) 0.5 MG tablet; Take 1 tablet (0.5 mg total) by mouth at bedtime.  Dispense: 30 tablet; Refill: 3  3. Encounter for long-term (current) use of medications - POCT Urine Drug Screen negative for all controlled medication  General Counseling: Lawrence Taylor verbalizes understanding of the findings of todays visit and agrees with plan of treatment. I have discussed any further diagnostic evaluation that may be needed or ordered today. We also reviewed his medications today. he has been encouraged to call the office with any questions or concerns that should arise related to todays visit.  Reviewed risks and possible side effects associated with taking opiates, benzodiazepines and other CNS depressants. Combination of these could cause dizziness and drowsiness. Advised patient not to drive or operate machinery when taking these medications, as patient's and other's life can be at risk and will have consequences. Patient verbalized understanding in this matter. Dependence and abuse for these drugs will be monitored closely. A Controlled substance policy and procedure is on file which allows [facility] to order a urine drug screen test at any visit. Patient understands and agrees with the plan  This patient was seen by Vincent GrosHeather Christin Mccreedy FNP Collaboration with Dr Lyndon CodeFozia M Khan as a part of collaborative care agreement  Orders Placed This Encounter  Procedures  . POCT Urine Drug Screen    Meds ordered this encounter  Medications  . clonazePAM (KLONOPIN) 0.5 MG tablet    Sig: Take 1 tablet (0.5 mg total) by mouth at bedtime.    Dispense:  30 tablet    Refill:  3    Order Specific Question:   Supervising Provider    Answer:   Lyndon CodeKHAN, FOZIA M [[id number]]  . levothyroxine (SYNTHROID) 50 MCG tablet    Sig: Take 1 tablet (50 mcg total) by mouth daily before breakfast.    Dispense:  90 tablet    Refill:  3    Patient does not need this filled yet. He will call the  pharmacy when he needs to get this filled. Thanks.    Order Specific Question:   Supervising Provider    Answer:   Lyndon CodeKHAN, FOZIA M [[id number]]    Time spent: 4925 Minutes      Dr Lyndon CodeFozia M Khan Internal medicine

## 2019-08-02 ENCOUNTER — Other Ambulatory Visit: Payer: Self-pay

## 2019-08-02 ENCOUNTER — Emergency Department: Payer: No Typology Code available for payment source

## 2019-08-02 DIAGNOSIS — Y9289 Other specified places as the place of occurrence of the external cause: Secondary | ICD-10-CM | POA: Diagnosis not present

## 2019-08-02 DIAGNOSIS — S6992XA Unspecified injury of left wrist, hand and finger(s), initial encounter: Secondary | ICD-10-CM | POA: Insufficient documentation

## 2019-08-02 DIAGNOSIS — Z79899 Other long term (current) drug therapy: Secondary | ICD-10-CM | POA: Insufficient documentation

## 2019-08-02 DIAGNOSIS — Z23 Encounter for immunization: Secondary | ICD-10-CM | POA: Insufficient documentation

## 2019-08-02 DIAGNOSIS — E039 Hypothyroidism, unspecified: Secondary | ICD-10-CM | POA: Insufficient documentation

## 2019-08-02 DIAGNOSIS — Y9389 Activity, other specified: Secondary | ICD-10-CM | POA: Diagnosis not present

## 2019-08-02 DIAGNOSIS — W231XXA Caught, crushed, jammed, or pinched between stationary objects, initial encounter: Secondary | ICD-10-CM | POA: Diagnosis not present

## 2019-08-02 DIAGNOSIS — Y990 Civilian activity done for income or pay: Secondary | ICD-10-CM | POA: Diagnosis not present

## 2019-08-02 NOTE — ED Triage Notes (Signed)
Pt arrives to ED via [facility] from work with c/o left thumb injury PTA. Pt reports his thumb was crushed by a piece of machinery. Pt arrives with bandage in place by EMS, bleeding controlled at this time. Pt also arrives with a 20g IV in the right [facility] that was placed by EMS.

## 2019-08-03 ENCOUNTER — Emergency Department
Admission: EM | Admit: 2019-08-03 | Discharge: 2019-08-03 | Disposition: A | Discharge status: Home / Self Care | Payer: No Typology Code available for payment source | Attending: Emergency Medicine | Admitting: Emergency Medicine

## 2019-08-03 DIAGNOSIS — S6992XA Unspecified injury of left wrist, hand and finger(s), initial encounter: Secondary | ICD-10-CM

## 2019-08-03 MED ORDER — OXYCODONE-ACETAMINOPHEN 5-325 MG PO TABS
1.0000 | ORAL_TABLET | Freq: Once | ORAL | Status: AC
  Administered 2019-08-03: 1 via ORAL
  Filled 2019-08-03: qty 1

## 2019-08-03 MED ORDER — OXYCODONE-ACETAMINOPHEN 5-325 MG PO TABS: 1.0000 | ORAL_TABLET | ORAL | 0 refills | Status: DC | PRN

## 2019-08-03 MED ORDER — ONDANSETRON 4 MG PO TBDP
4.0000 mg | ORAL_TABLET | Freq: Once | ORAL | Status: AC
  Administered 2019-08-03: 4 mg via ORAL
  Filled 2019-08-03: qty 1

## 2019-08-03 MED ORDER — TETANUS-DIPHTH-ACELL PERTUSSIS 5-2.5-18.5 LF-MCG/0.5 IM SUSP
0.5000 mL | Freq: Once | INTRAMUSCULAR | Status: AC
  Administered 2019-08-03: 0.5 mL via INTRAMUSCULAR
  Filled 2019-08-03: qty 0.5

## 2019-08-03 MED ORDER — IBUPROFEN 800 MG PO TABS: 800.0000 mg | ORAL_TABLET | Freq: Three times a day (TID) | ORAL | 0 refills | Status: DC | PRN

## 2019-08-03 NOTE — ED Provider Notes (Signed)
[facility] Emergency Department Provider Note   ____________________________________________   First MD Initiated Contact with Patient 08/03/19 [phone]     (approximate)  I have reviewed the triage vital signs and the nursing notes.   HISTORY  Chief Complaint Hand Injury    HPI Lawrence Taylor is a 33 y.o. male brought to the ED via EMS from work with a chief complaint of left thumb injury.  Patient states left thumb was crushed and robotic pliers.  Tetanus is unknown.  Patient is right-hand dominant.  Arrives with bandage in place with bleeding controlled.  No other complaints or injuries other than left thumb.       Past Medical History:  Diagnosis Date  . Thyroid disease     Patient Active Problem List   Diagnosis Date Noted  . Chest pain 02/27/2018  . Encounter for long-term (current) use of medications 02/27/2018  . Costochondral chest pain 02/27/2018  . Acquired hypothyroidism 11/22/2017  . Generalized anxiety disorder 11/22/2017    History reviewed. No pertinent surgical history.  Prior to Admission medications   Medication Sig Start Date End Date Taking? Authorizing Provider  clonazePAM (KLONOPIN) 0.5 MG tablet Take 1 tablet (0.5 mg total) by mouth at bedtime. 07/09/19   Ronnell Freshwater, NP  ibuprofen (ADVIL) 800 MG tablet Take 1 tablet (800 mg total) by mouth every 8 (eight) hours as needed for moderate pain. 08/03/19   Paulette Blanch, MD  levothyroxine (SYNTHROID) 50 MCG tablet Take 1 tablet (50 mcg total) by mouth daily before breakfast. 07/09/19   Ronnell Freshwater, NP  omeprazole (PRILOSEC) 10 MG capsule Take 1 capsule (10 mg total) by mouth daily. 01/21/18   Cuthriell, Charline Bills, PA-C  oxyCODONE-acetaminophen (PERCOCET/ROXICET) 5-325 MG tablet Take 1 tablet by mouth every 4 (four) hours as needed for severe pain. 08/03/19   Paulette Blanch, MD    Allergies Patient has no known allergies.  Family History  Problem Relation Age of  Onset  . Kidney cancer Neg Hx   . Prostate cancer Neg Hx   . Bladder Cancer Neg Hx     Social History Social History   Tobacco Use  . Smoking status: Never Smoker  . Smokeless tobacco: Never Used  Substance Use Topics  . Alcohol use: Not Currently    Comment: once every 1-65yrs  . Drug use: No    Review of Systems  Constitutional: No fever/chills Eyes: No visual changes. ENT: No sore throat. Cardiovascular: Denies chest pain. Respiratory: Denies shortness of breath. Gastrointestinal: No abdominal pain.  No nausea, no vomiting.  No diarrhea.  No constipation. Genitourinary: Negative for dysuria. Musculoskeletal: Positive for left thumb pain and injury.  Negative for back pain. Skin: Negative for rash. Neurological: Negative for headaches, focal weakness or numbness.   ____________________________________________   PHYSICAL EXAM:  VITAL SIGNS: ED Triage Vitals  Enc Vitals Group     BP 08/02/19 2348 (!) 136/92     Pulse Rate 08/02/19 2348 (!) 56     Resp 08/02/19 2348 17     Temp 08/02/19 2348 98.7 F (37.1 C)     Temp Source 08/02/19 2348 Oral     SpO2 08/02/19 2348 98 %     Weight 08/02/19 2344 182 lb (82.6 kg)     Height 08/02/19 2344 5\' 8"  (1.727 m)     Head Circumference --      Peak Flow --      Pain Score 08/02/19 2343  9     Pain Loc --      Pain Edu? --      Excl. in GC? --     Constitutional: Alert and oriented. Well appearing and in no acute distress. Eyes: Conjunctivae are normal. PERRL. EOMI. Head: Atraumatic. Nose: No congestion/rhinnorhea. Mouth/Throat: Mucous membranes are moist.  Oropharynx non-erythematous. Neck: No stridor.   Cardiovascular: Normal rate, regular rhythm. Grossly normal heart sounds.  Good peripheral circulation. Respiratory: Normal respiratory effort.  No retractions. Lungs CTAB. Gastrointestinal: Soft and nontender. No distention. No abdominal bruits. No CVA tenderness. Musculoskeletal:  Bandage stuck to finger.  Will  soak finger and reexamine. No lower extremity tenderness nor edema.  No joint effusions. Neurologic:  Normal speech and language. No gross focal neurologic deficits are appreciated. No gait instability. Skin:  Skin is warm, dry and intact. No rash noted. Psychiatric: Mood and affect are normal. Speech and behavior are normal.  ____________________________________________   LABS (all labs ordered are listed, but only abnormal results are displayed)  Labs Reviewed - No data to display ____________________________________________  EKG  None ____________________________________________  RADIOLOGY  ED MD interpretation: No acute osseous injury  Official radiology report(s): Dg Finger Thumb Left  Result Date: 08/03/2019 CLINICAL DATA:  Left thumb pain after injury. Fall most crushed by piece of machinery. EXAM: LEFT THUMB 2+V COMPARISON:  None. FINDINGS: There is no evidence of fracture or dislocation. There is no evidence of arthropathy or other focal bone abnormality. Soft tissue edema about the distal digit with overlying dressing in place. No soft tissue air or radiopaque foreign body. IMPRESSION: Soft tissue edema about the distal digit. No osseous abnormality. Electronically Signed   By: Narda RutherfordMelanie  Sanford M.D.   On: 08/03/2019 00:18    ____________________________________________   PROCEDURES  Procedure(s) performed (including Critical Care):  Procedures   ____________________________________________   INITIAL IMPRESSION / ASSESSMENT AND PLAN / ED COURSE  As part of my medical decision making, I reviewed the following data within the electronic MEDICAL RECORD NUMBER Nursing notes reviewed and incorporated, Radiograph reviewed, Notes from prior ED visits and [place] Controlled Substance Database     [name] was evaluated in Emergency Department on 08/03/2019 for the symptoms described in the history of present illness. He was evaluated in the context of the global  COVID-19 pandemic, which necessitated consideration that the patient might be at risk for infection with the SARS-CoV-2 virus that causes COVID-19. Institutional protocols and algorithms that pertain to the evaluation of patients at risk for COVID-19 are in a state of rapid change based on information released by regulatory bodies including the CDC and federal and state organizations. These policies and algorithms were followed during the patient's care in the ED.    33 year old male who presents with left thumb crush injury.  No osseous abnormality on x-ray.  Will reexamine finger after soaking off the bandages.   Clinical Course as of Aug 02 604  Tue Aug 03, 2019  0419 Reexamined after soaking.  There is a hairline break in the nail extending vertically from cuticle on the medial/lateral aspect.  There is no underlying nailbed injury.  Tiny subungual hematoma.  There is no active bleeding.  Will apply nonadherent dressing and discharge with prescription for Percocet to use as needed.  Strict return precautions given.  Patient verbalizes understanding and agrees with plan of care.   [JS]    Clinical Course User Index [JS] Irean HongSung, Roniyah Llorens J, MD     ____________________________________________  FINAL CLINICAL IMPRESSION(S) / ED DIAGNOSES  Final diagnoses:  Injury of finger of left hand, initial encounter     ED Discharge Orders         Ordered    ibuprofen (ADVIL) 800 MG tablet  Every 8 hours PRN     08/03/19 0426    oxyCODONE-acetaminophen (PERCOCET/ROXICET) 5-325 MG tablet  Every 4 hours PRN     08/03/19 0426           Note:  This document was prepared using Dragon voice recognition software and may include unintentional dictation errors.   Irean Hong, MD 08/03/19 [phone]

## 2019-08-03 NOTE — ED Notes (Signed)
Finger soaking at this time.

## 2019-08-03 NOTE — Discharge Instructions (Signed)
1.  You may take ibuprofen as needed for pain, Percocet as needed for more severe pain. 2.  Keep wound clean and dry. 3.  Return to the ER for worsening symptoms, persistent vomiting, difficulty breathing or other concerns.

## 2019-08-03 NOTE — ED Notes (Signed)
Pt employer requests uds for worker's comp.  Consent signed, specimen obtained and released to lab via chain of custody protocol.

## 2019-11-02 ENCOUNTER — Telehealth: Payer: Self-pay

## 2019-11-02 NOTE — Telephone Encounter (Signed)
CONFIRMED AND SCREENED FOR 11-04-19 OV. 

## 2019-11-02 NOTE — Telephone Encounter (Signed)
CALLED TWICE NO ANSWER,NO VM SET UP.

## 2019-11-04 ENCOUNTER — Ambulatory Visit (INDEPENDENT_AMBULATORY_CARE_PROVIDER_SITE_OTHER): Payer: BC Managed Care – PPO | Admitting: Nurse Practitioner

## 2019-11-04 ENCOUNTER — Other Ambulatory Visit: Payer: Self-pay

## 2019-11-04 ENCOUNTER — Encounter: Payer: Self-pay | Admitting: Nurse Practitioner

## 2019-11-04 VITALS — BP 120/69 | HR 74 | Temp 98.2°F | Resp 16 | Ht 68.0 in | Wt 171.0 lb

## 2019-11-04 DIAGNOSIS — F411 Generalized anxiety disorder: Secondary | ICD-10-CM | POA: Diagnosis not present

## 2019-11-04 DIAGNOSIS — Z79899 Other long term (current) drug therapy: Secondary | ICD-10-CM

## 2019-11-04 DIAGNOSIS — E039 Hypothyroidism, unspecified: Secondary | ICD-10-CM

## 2019-11-04 LAB — POCT URINE DRUG SCREEN
POC Amphetamine UR: NOT DETECTED
POC BENZODIAZEPINES UR: NOT DETECTED
POC Barbiturate UR: NOT DETECTED
POC Cocaine UR: NOT DETECTED
POC Ecstasy UR: NOT DETECTED
POC Marijuana UR: NOT DETECTED
POC Methadone UR: NOT DETECTED
POC Methamphetamine UR: NOT DETECTED
POC Opiate Ur: NOT DETECTED
POC Oxycodone UR: NOT DETECTED
POC PHENCYCLIDINE UR: NOT DETECTED
POC TRICYCLICS UR: NOT DETECTED

## 2019-11-04 MED ORDER — CLONAZEPAM 0.5 MG PO TABS: 0.5000 mg | ORAL_TABLET | Freq: Every day | ORAL | 3 refills | Status: DC

## 2019-11-04 MED ORDER — LEVOTHYROXINE SODIUM 50 MCG PO TABS: 50.0000 ug | ORAL_TABLET | Freq: Every day | ORAL | 3 refills | Status: DC

## 2019-11-04 NOTE — Progress Notes (Signed)
[facility] [address] [place], [place] [address]  Internal MEDICINE  Office Visit Note  Patient Name: Lawrence Taylor  [id number]  [id number]  Date of Service: 11/07/2019  Chief Complaint  Patient presents with  . Hypothyroidism    The patient is here for routine follow up exam. He continues to use clonazepam 0.5mg  at bedtime to help him sleep. He is able to take this medication at bedtime and it enables him to fall asleep and stay sleep without issue. He needs to have this medication filled today.  He has no new concerns or complaints today. Continues to take levothyroxine daily. He is due to have his thyroid panel checked.       Current Medication: Outpatient Encounter Medications as of 11/04/2019  Medication Sig  . clonazePAM (KLONOPIN) 0.5 MG tablet Take 1 tablet (0.5 mg total) by mouth at bedtime.  Marland Kitchen ibuprofen (ADVIL) 800 MG tablet Take 1 tablet (800 mg total) by mouth every 8 (eight) hours as needed for moderate pain.  Marland Kitchen levothyroxine (SYNTHROID) 50 MCG tablet Take 1 tablet (50 mcg total) by mouth daily before breakfast.  . omeprazole (PRILOSEC) 10 MG capsule Take 1 capsule (10 mg total) by mouth daily.  Marland Kitchen oxyCODONE-acetaminophen (PERCOCET/ROXICET) 5-325 MG tablet Take 1 tablet by mouth every 4 (four) hours as needed for severe pain.  . [DISCONTINUED] clonazePAM (KLONOPIN) 0.5 MG tablet Take 1 tablet (0.5 mg total) by mouth at bedtime.  . [DISCONTINUED] levothyroxine (SYNTHROID) 50 MCG tablet Take 1 tablet (50 mcg total) by mouth daily before breakfast.   No facility-administered encounter medications on file as of 11/04/2019.    Surgical History: History reviewed. No pertinent surgical history.  Medical History: Past Medical History:  Diagnosis Date  . Thyroid disease     Family History: Family History  Problem Relation Age of Onset  . Kidney cancer Neg Hx   . Prostate cancer Neg Hx   . Bladder Cancer Neg Hx     Social History    Socioeconomic History  . Marital status: Single    Spouse name: Not on file  . Number of children: Not on file  . Years of education: Not on file  . Highest education level: Not on file  Occupational History  . Not on file  Tobacco Use  . Smoking status: Never Smoker  . Smokeless tobacco: Never Used  Substance and Sexual Activity  . Alcohol use: Yes    Comment: once every 1-22yrs  . Drug use: No  . Sexual activity: Not on file  Other Topics Concern  . Not on file  Social History Narrative  . Not on file   Social Determinants of Health   Financial Resource Strain:   . Difficulty of Paying Living Expenses:   Food Insecurity:   . Worried About Programme researcher, broadcasting/film/video in the Last Year:   . Barista in the Last Year:   Transportation Needs:   . Freight forwarder (Medical):   Marland Kitchen Lack of Transportation (Non-Medical):   Physical Activity:   . Days of Exercise per Week:   . Minutes of Exercise per Session:   Stress:   . Feeling of Stress :   Social Connections:   . Frequency of Communication with Friends and Family:   . Frequency of Social Gatherings with Friends and Family:   . Attends Religious Services:   . Active Member of Clubs or Organizations:   . Attends Banker Meetings:   .  Marital Status:   Intimate Partner Violence:   . Fear of Current or Ex-Partner:   . Emotionally Abused:   Marland Kitchen Physically Abused:   . Sexually Abused:       Review of Systems  Constitutional: Negative for activity change, chills, fatigue and unexpected weight change.  HENT: Negative for congestion, postnasal drip, rhinorrhea, sneezing and sore throat.   Respiratory: Negative for cough, chest tightness and shortness of breath.   Cardiovascular: Negative for chest pain and palpitations.  Gastrointestinal: Negative for abdominal pain, constipation, diarrhea, nausea and vomiting.  Endocrine: Negative for cold intolerance, heat intolerance, polydipsia and polyuria.        Due to have thyroid panel checked.   Musculoskeletal: Negative for arthralgias, back pain, joint swelling and neck pain.  Skin: Negative for rash.  Neurological: Negative for dizziness, tremors, numbness and headaches.  Hematological: Negative for adenopathy. Does not bruise/bleed easily.  Psychiatric/Behavioral: Positive for sleep disturbance. Negative for behavioral problems (Depression) and suicidal ideas. The patient is nervous/anxious.    Today's Vitals   11/04/19 1206  BP: 120/69  Pulse: 74  Resp: 16  Temp: 98.2 F (36.8 C)  SpO2: 95%  Weight: 171 lb (77.6 kg)  Height: 5\' 8"  (1.727 m)   Body mass index is 26 kg/m.  Physical Exam Vitals and nursing note reviewed.  Constitutional:      General: He is not in acute distress.    Appearance: Normal appearance. He is well-developed. He is not diaphoretic.  HENT:     Head: Normocephalic and atraumatic.     Mouth/Throat:     Pharynx: No oropharyngeal exudate.  Eyes:     Pupils: Pupils are equal, round, and reactive to light.  Cardiovascular:     Rate and Rhythm: Normal rate and regular rhythm.     Heart sounds: Normal heart sounds. No murmur. No friction rub. No gallop.   Pulmonary:     Effort: Pulmonary effort is normal. No respiratory distress.     Breath sounds: Normal breath sounds. No wheezing or rales.  Chest:     Chest wall: No tenderness.  Abdominal:     Tenderness: There is no abdominal tenderness.  Musculoskeletal:        General: Normal range of motion.     Cervical back: Normal range of motion and neck supple.  Skin:    General: Skin is warm and dry.  Neurological:     Mental Status: He is alert and oriented to person, place, and time.     Cranial Nerves: No cranial nerve deficit.  Psychiatric:        Mood and Affect: Mood normal.        Behavior: Behavior normal.        Thought Content: Thought content normal.        Judgment: Judgment normal.    Assessment/Plan: 1. Acquired hypothyroidism Continue  levothyroxine at current dose. Check thyroid panel and adjust dosing as indicated.  - levothyroxine (SYNTHROID) 50 MCG tablet; Take 1 tablet (50 mcg total) by mouth daily before breakfast.  Dispense: 90 tablet; Refill: 3  2. Generalized anxiety disorder May take clonazepam 0.5mg  tablets as needed for anxiety/insomnia. New prescription sent to his pharmacy today. - clonazePAM (KLONOPIN) 0.5 MG tablet; Take 1 tablet (0.5 mg total) by mouth at bedtime.  Dispense: 30 tablet; Refill: 3  3. Encounter for long-term (current) use of medications - POCT Urine Drug Screen negative for all controlled substances.   General Counseling: Garyn verbalizes understanding of  the findings of todays visit and agrees with plan of treatment. I have discussed any further diagnostic evaluation that may be needed or ordered today. We also reviewed his medications today. he has been encouraged to call the office with any questions or concerns that should arise related to todays visit.  This patient was seen by Leretha Pol FNP Collaboration with Dr Lavera Guise as a part of collaborative care agreement  Orders Placed This Encounter  Procedures  . POCT Urine Drug Screen    Meds ordered this encounter  Medications  . levothyroxine (SYNTHROID) 50 MCG tablet    Sig: Take 1 tablet (50 mcg total) by mouth daily before breakfast.    Dispense:  90 tablet    Refill:  3    Please fill this as 90 day prescription for this. Thanks.    Order Specific Question:   Supervising Provider    Answer:   Lavera Guise [[id number]]  . clonazePAM (KLONOPIN) 0.5 MG tablet    Sig: Take 1 tablet (0.5 mg total) by mouth at bedtime.    Dispense:  30 tablet    Refill:  3    Order Specific Question:   Supervising Provider    Answer:   Lavera Guise [[id number]]    Total time spent: 30 Minutes  Time spent includes review of chart, medications, test results, and follow up plan with the patient.      Dr Lavera Guise Internal medicine

## 2020-03-03 ENCOUNTER — Ambulatory Visit: Payer: BC Managed Care – PPO | Admitting: Nurse Practitioner

## 2020-03-03 ENCOUNTER — Other Ambulatory Visit: Payer: Self-pay

## 2020-03-03 DIAGNOSIS — F411 Generalized anxiety disorder: Secondary | ICD-10-CM

## 2020-03-06 ENCOUNTER — Encounter: Payer: Self-pay | Admitting: Nurse Practitioner

## 2020-03-06 ENCOUNTER — Other Ambulatory Visit: Payer: Self-pay

## 2020-03-06 ENCOUNTER — Ambulatory Visit: Payer: BC Managed Care – PPO | Admitting: Nurse Practitioner

## 2020-03-06 ENCOUNTER — Ambulatory Visit (INDEPENDENT_AMBULATORY_CARE_PROVIDER_SITE_OTHER): Payer: BC Managed Care – PPO | Admitting: Nurse Practitioner

## 2020-03-06 VITALS — BP 121/80 | HR 66 | Temp 97.3°F | Resp 16 | Ht 68.0 in | Wt 159.8 lb

## 2020-03-06 DIAGNOSIS — F411 Generalized anxiety disorder: Secondary | ICD-10-CM

## 2020-03-06 DIAGNOSIS — E039 Hypothyroidism, unspecified: Secondary | ICD-10-CM | POA: Diagnosis not present

## 2020-03-06 MED ORDER — CLONAZEPAM 0.5 MG PO TABS: 0.5000 mg | ORAL_TABLET | Freq: Every day | ORAL | 3 refills | Status: DC

## 2020-03-06 NOTE — Progress Notes (Signed)
[facility] [address] [place], [place] [address]  Internal MEDICINE  Office Visit Note  Patient Name: Lawrence Taylor  [id number]  [id number]  Date of Service: 03/06/2020  Chief Complaint  Patient presents with   Follow-up    The patient is here for routine follow up. He has hypothyroid which has been well managed. It is time to have thyroid panel checked and medication adjusted as indicated. Has not slept well in a few nights. He generally takes clonazepam to help with sleep related issues. Without it, he is usually unable to fall asleep. Ran out on Thursday. Does need to have refills for this today. Does not have negative side effects from taking the medication.       Current Medication: Outpatient Encounter Medications as of 03/06/2020  Medication Sig   clonazePAM (KLONOPIN) 0.5 MG tablet Take 1 tablet (0.5 mg total) by mouth at bedtime.   ibuprofen (ADVIL) 800 MG tablet Take 1 tablet (800 mg total) by mouth every 8 (eight) hours as needed for moderate pain.   levothyroxine (SYNTHROID) 50 MCG tablet Take 1 tablet (50 mcg total) by mouth daily before breakfast.   omeprazole (PRILOSEC) 10 MG capsule Take 1 capsule (10 mg total) by mouth daily.   oxyCODONE-acetaminophen (PERCOCET/ROXICET) 5-325 MG tablet Take 1 tablet by mouth every 4 (four) hours as needed for severe pain.   [DISCONTINUED] clonazePAM (KLONOPIN) 0.5 MG tablet Take 1 tablet (0.5 mg total) by mouth at bedtime.   No facility-administered encounter medications on file as of 03/06/2020.    Surgical History: History reviewed. No pertinent surgical history.  Medical History: Past Medical History:  Diagnosis Date   Thyroid disease     Family History: Family History  Problem Relation Age of Onset   Kidney cancer Neg Hx    Prostate cancer Neg Hx    Bladder Cancer Neg Hx     Social History   Socioeconomic History   Marital status: Single    Spouse name: Not on file   Number  of children: Not on file   Years of education: Not on file   Highest education level: Not on file  Occupational History   Not on file  Tobacco Use   Smoking status: Never Smoker   Smokeless tobacco: Never Used  Substance and Sexual Activity   Alcohol use: Yes    Comment: when needed    Drug use: No   Sexual activity: Not on file  Other Topics Concern   Not on file  Social History Narrative   Not on file   Social Determinants of Health   Financial Resource Strain:    Difficulty of Paying Living Expenses:   Food Insecurity:    Worried About Programme researcher, broadcasting/film/video in the Last Year:    Barista in the Last Year:   Transportation Needs:    Freight forwarder (Medical):    Lack of Transportation (Non-Medical):   Physical Activity:    Days of Exercise per Week:    Minutes of Exercise per Session:   Stress:    Feeling of Stress :   Social Connections:    Frequency of Communication with Friends and Family:    Frequency of Social Gatherings with Friends and Family:    Attends Religious Services:    Active Member of Clubs or Organizations:    Attends Banker Meetings:    Marital Status:   Intimate Partner Violence:    Fear of Current  or Ex-Partner:    Emotionally Abused:    Physically Abused:    Sexually Abused:       Review of Systems  Constitutional: Negative for activity change, chills, fatigue and unexpected weight change.  HENT: Negative for congestion, postnasal drip, rhinorrhea, sneezing and sore throat.   Respiratory: Negative for cough, chest tightness and shortness of breath.   Cardiovascular: Negative for chest pain and palpitations.  Gastrointestinal: Negative for abdominal pain, constipation, diarrhea, nausea and vomiting.  Endocrine: Negative for cold intolerance, heat intolerance, polydipsia and polyuria.       Due to have thyroid panel checked.   Musculoskeletal: Negative for arthralgias, back pain, joint  swelling and neck pain.  Skin: Negative for rash.  Neurological: Negative for dizziness, tremors, numbness and headaches.  Hematological: Negative for adenopathy. Does not bruise/bleed easily.  Psychiatric/Behavioral: Positive for sleep disturbance. Negative for behavioral problems (Depression) and suicidal ideas. The patient is nervous/anxious.     Today's Vitals   03/06/20 1101  BP: 121/80  Pulse: 66  Resp: 16  Temp: (!) 97.3 F (36.3 C)  SpO2: 98%  Weight: 159 lb 12.8 oz (72.5 kg)  Height: 5\' 8"  (1.727 m)   Body mass index is 24.3 kg/m.  Physical Exam Vitals and nursing note reviewed.  Constitutional:      General: He is not in acute distress.    Appearance: Normal appearance. He is well-developed. He is not diaphoretic.  HENT:     Head: Normocephalic and atraumatic.     Mouth/Throat:     Pharynx: No oropharyngeal exudate.  Eyes:     Pupils: Pupils are equal, round, and reactive to light.  Cardiovascular:     Rate and Rhythm: Normal rate and regular rhythm.     Heart sounds: Normal heart sounds. No murmur heard.  No friction rub. No gallop.   Pulmonary:     Effort: Pulmonary effort is normal. No respiratory distress.     Breath sounds: Normal breath sounds. No wheezing or rales.  Chest:     Chest wall: No tenderness.  Abdominal:     Tenderness: There is no abdominal tenderness.  Musculoskeletal:        General: Normal range of motion.     Cervical back: Normal range of motion and neck supple.  Skin:    General: Skin is warm and dry.  Neurological:     Mental Status: He is alert and oriented to person, place, and time.     Cranial Nerves: No cranial nerve deficit.  Psychiatric:        Mood and Affect: Mood normal.        Behavior: Behavior normal.        Thought Content: Thought content normal.        Judgment: Judgment normal.    Assessment/Plan:  1. Acquired hypothyroidism Check thyroid panel and adjust dose of levothyroxine as indicated.   2.  Generalized anxiety disorder May take clonazepam 0.5mg  at bedtime as needed for anxiety/insomnia. New prescription sent to his pharmacy today. - clonazePAM (KLONOPIN) 0.5 MG tablet; Take 1 tablet (0.5 mg total) by mouth at bedtime.  Dispense: 30 tablet; Refill: 3   General Counseling: Ly verbalizes understanding of the findings of todays visit and agrees with plan of treatment. I have discussed any further diagnostic evaluation that may be needed or ordered today. We also reviewed his medications today. he has been encouraged to call the office with any questions or concerns that should arise related to todays  visit.  This patient was seen by Vincent Gros FNP Collaboration with Dr Lyndon Code as a part of collaborative care agreement  Meds ordered this encounter  Medications   clonazePAM (KLONOPIN) 0.5 MG tablet    Sig: Take 1 tablet (0.5 mg total) by mouth at bedtime.    Dispense:  30 tablet    Refill:  3    Order Specific Question:   Supervising Provider    Answer:   Lyndon Code [[id number]]    Total time spent: 20 Minutes   Time spent includes review of chart, medications, test results, and follow up plan with the patient.      Dr Lyndon Code Internal medicine

## 2020-07-03 ENCOUNTER — Encounter: Payer: Self-pay | Admitting: Nurse Practitioner

## 2020-07-03 ENCOUNTER — Other Ambulatory Visit: Payer: Self-pay

## 2020-07-03 ENCOUNTER — Ambulatory Visit (INDEPENDENT_AMBULATORY_CARE_PROVIDER_SITE_OTHER): Payer: BC Managed Care – PPO | Admitting: Nurse Practitioner

## 2020-07-03 VITALS — BP 122/75 | HR 74 | Temp 98.8°F | Resp 16 | Ht 68.0 in | Wt 172.2 lb

## 2020-07-03 DIAGNOSIS — F411 Generalized anxiety disorder: Secondary | ICD-10-CM | POA: Diagnosis not present

## 2020-07-03 DIAGNOSIS — E039 Hypothyroidism, unspecified: Secondary | ICD-10-CM | POA: Diagnosis not present

## 2020-07-03 DIAGNOSIS — Z79899 Other long term (current) drug therapy: Secondary | ICD-10-CM | POA: Diagnosis not present

## 2020-07-03 LAB — POCT URINE DRUG SCREEN
Methylenedioxyamphetamine: NOT DETECTED
POC Amphetamine UR: NOT DETECTED
POC BENZODIAZEPINES UR: NOT DETECTED
POC Barbiturate UR: NOT DETECTED
POC Cocaine UR: NOT DETECTED
POC Ecstasy UR: NOT DETECTED
POC Marijuana UR: NOT DETECTED
POC Methadone UR: NOT DETECTED
POC Methamphetamine UR: NOT DETECTED
POC Opiate Ur: NOT DETECTED
POC Oxycodone UR: NOT DETECTED
POC PHENCYCLIDINE UR: NOT DETECTED
POC TRICYCLICS UR: NOT DETECTED

## 2020-07-03 MED ORDER — CLONAZEPAM 0.5 MG PO TABS: 0.5000 mg | ORAL_TABLET | Freq: Every evening | ORAL | 2 refills | Status: DC | PRN

## 2020-07-03 NOTE — Progress Notes (Signed)
[facility] [address] [place], [place] [address]  Internal MEDICINE  Office Visit Note  Patient Name: Lawrence Taylor  [id number]  [id number]  Date of Service: 07/23/2020  Chief Complaint  Patient presents with  . Follow-up  . controlled substance form    reviewed with PT  . Medication Refill    The patient is here for routine follow up. He generally takes clonazepam to help with sleep related issues. Without it, he is usually unable to fall asleep. Has tried other medications and changed bedtime routine to help with healthy sleep habits. Other medications were ineffective or caused negative side effects. He continues with healthy sleep hygiene, but is unable to sleep well without clonazepam. Patient also has hypothyroid. He is due to have check of thyroid panel.       Current Medication: Outpatient Encounter Medications as of 07/03/2020  Medication Sig  . clonazePAM (KLONOPIN) 0.5 MG tablet Take 1 tablet (0.5 mg total) by mouth at bedtime as needed for anxiety.  Marland Kitchen ibuprofen (ADVIL) 800 MG tablet Take 1 tablet (800 mg total) by mouth every 8 (eight) hours as needed for moderate pain.  Marland Kitchen levothyroxine (SYNTHROID) 50 MCG tablet Take 1 tablet (50 mcg total) by mouth daily before breakfast.  . omeprazole (PRILOSEC) 10 MG capsule Take 1 capsule (10 mg total) by mouth daily.  Marland Kitchen oxyCODONE-acetaminophen (PERCOCET/ROXICET) 5-325 MG tablet Take 1 tablet by mouth every 4 (four) hours as needed for severe pain.  . [DISCONTINUED] clonazePAM (KLONOPIN) 0.5 MG tablet Take 1 tablet (0.5 mg total) by mouth at bedtime.   No facility-administered encounter medications on file as of 07/03/2020.    Surgical History: History reviewed. No pertinent surgical history.  Medical History: Past Medical History:  Diagnosis Date  . Thyroid disease     Family History: Family History  Problem Relation Age of Onset  . Kidney cancer Neg Hx   . Prostate cancer Neg Hx   . Bladder  Cancer Neg Hx     Social History   Socioeconomic History  . Marital status: Single    Spouse name: Not on file  . Number of children: Not on file  . Years of education: Not on file  . Highest education level: Not on file  Occupational History  . Not on file  Tobacco Use  . Smoking status: Never Smoker  . Smokeless tobacco: Never Used  Substance and Sexual Activity  . Alcohol use: Yes    Comment: rarley  . Drug use: No  . Sexual activity: Not on file  Other Topics Concern  . Not on file  Social History Narrative  . Not on file   Social Determinants of Health   Financial Resource Strain:   . Difficulty of Paying Living Expenses: Not on file  Food Insecurity:   . Worried About Programme researcher, broadcasting/film/video in the Last Year: Not on file  . Ran Out of Food in the Last Year: Not on file  Transportation Needs:   . Lack of Transportation (Medical): Not on file  . Lack of Transportation (Non-Medical): Not on file  Physical Activity:   . Days of Exercise per Week: Not on file  . Minutes of Exercise per Session: Not on file  Stress:   . Feeling of Stress : Not on file  Social Connections:   . Frequency of Communication with Friends and Family: Not on file  . Frequency of Social Gatherings with Friends and Family: Not on file  .  Attends Religious Services: Not on file  . Active Member of Clubs or Organizations: Not on file  . Attends Banker Meetings: Not on file  . Marital Status: Not on file  Intimate Partner Violence:   . Fear of Current or Ex-Partner: Not on file  . Emotionally Abused: Not on file  . Physically Abused: Not on file  . Sexually Abused: Not on file      Review of Systems  Constitutional: Positive for fatigue. Negative for activity change, chills and unexpected weight change.  HENT: Negative for congestion, postnasal drip, rhinorrhea, sneezing and sore throat.   Respiratory: Negative for cough, chest tightness and shortness of breath.    Cardiovascular: Negative for chest pain and palpitations.  Gastrointestinal: Negative for abdominal pain, constipation, diarrhea, nausea and vomiting.  Endocrine: Negative for cold intolerance, heat intolerance, polydipsia and polyuria.       Due to have thyroid panel checked.   Musculoskeletal: Negative for arthralgias, back pain, joint swelling and neck pain.  Skin: Negative for rash.  Neurological: Negative for dizziness, tremors, numbness and headaches.  Hematological: Negative for adenopathy. Does not bruise/bleed easily.  Psychiatric/Behavioral: Positive for sleep disturbance. Negative for behavioral problems (Depression) and suicidal ideas. The patient is nervous/anxious.     Today's Vitals   07/03/20 1145  BP: 122/75  Pulse: 74  Resp: 16  Temp: 98.8 F (37.1 C)  SpO2: 99%  Weight: 172 lb 3.2 oz (78.1 kg)  Height: 5\' 8"  (1.727 m)   Body mass index is 26.18 kg/m.  Physical Exam Vitals and nursing note reviewed.  Constitutional:      General: He is not in acute distress.    Appearance: Normal appearance. He is well-developed. He is not diaphoretic.  HENT:     Head: Normocephalic and atraumatic.     Mouth/Throat:     Pharynx: No oropharyngeal exudate.  Eyes:     Pupils: Pupils are equal, round, and reactive to light.  Cardiovascular:     Rate and Rhythm: Normal rate and regular rhythm.     Heart sounds: Normal heart sounds. No murmur heard.  No friction rub. No gallop.   Pulmonary:     Effort: Pulmonary effort is normal. No respiratory distress.     Breath sounds: Normal breath sounds. No wheezing or rales.  Chest:     Chest wall: No tenderness.  Abdominal:     Tenderness: There is no abdominal tenderness.  Musculoskeletal:        General: Normal range of motion.     Cervical back: Normal range of motion and neck supple.  Skin:    General: Skin is warm and dry.  Neurological:     Mental Status: He is alert and oriented to person, place, and time.      Cranial Nerves: No cranial nerve deficit.  Psychiatric:        Mood and Affect: Mood normal.        Behavior: Behavior normal.        Thought Content: Thought content normal.        Judgment: Judgment normal.    Assessment/Plan: 1. Acquired hypothyroidism Will check thyroid panel and adjust levothyroxine as indicated.   2. Generalized anxiety disorder May take clonazepam 0.5mg  at bedtime as needed for anxiety/insomnia. New prescription sent to his pharmacy today.  - clonazePAM (KLONOPIN) 0.5 MG tablet; Take 1 tablet (0.5 mg total) by mouth at bedtime as needed for anxiety.  Dispense: 30 tablet; Refill: 2  3.  Encounter for long-term (current) use of medications - POCT Urine Drug Screen negative for controlled substances.   General Counseling: Court verbalizes understanding of the findings of todays visit and agrees with plan of treatment. I have discussed any further diagnostic evaluation that may be needed or ordered today. We also reviewed his medications today. he has been encouraged to call the office with any questions or concerns that should arise related to todays visit.  This patient was seen by Vincent Gros FNP Collaboration with Dr Lyndon Code as a part of collaborative care agreement  Orders Placed This Encounter  Procedures  . POCT Urine Drug Screen    Meds ordered this encounter  Medications  . clonazePAM (KLONOPIN) 0.5 MG tablet    Sig: Take 1 tablet (0.5 mg total) by mouth at bedtime as needed for anxiety.    Dispense:  30 tablet    Refill:  2    Order Specific Question:   Supervising Provider    Answer:   Lyndon Code [[id number]]    Total time spent: 25 Minutes   Time spent includes review of chart, medications, test results, and follow up plan with the patient.      Dr Lyndon Code Internal medicine

## 2020-09-01 ENCOUNTER — Telehealth: Payer: Self-pay

## 2020-09-01 ENCOUNTER — Other Ambulatory Visit: Payer: Self-pay

## 2020-09-01 ENCOUNTER — Other Ambulatory Visit: Payer: Self-pay | Admitting: Nurse Practitioner

## 2020-09-01 DIAGNOSIS — E039 Hypothyroidism, unspecified: Secondary | ICD-10-CM

## 2020-09-01 DIAGNOSIS — F411 Generalized anxiety disorder: Secondary | ICD-10-CM

## 2020-09-01 MED ORDER — CLONAZEPAM 0.5 MG PO TABS: 0.5000 mg | ORAL_TABLET | Freq: Every evening | ORAL | 0 refills | Status: DC | PRN

## 2020-09-01 MED ORDER — LEVOTHYROXINE SODIUM 50 MCG PO TABS: 50.0000 ug | ORAL_TABLET | Freq: Every day | ORAL | 0 refills | Status: DC

## 2020-09-01 NOTE — Telephone Encounter (Signed)
Sent single 30 day prescription sent to [facility] with no refills. Patient has appointment 09/28/2020

## 2020-09-18 ENCOUNTER — Other Ambulatory Visit: Payer: Self-pay | Admitting: Nurse Practitioner

## 2020-09-18 DIAGNOSIS — Z0001 Encounter for general adult medical examination with abnormal findings: Secondary | ICD-10-CM | POA: Diagnosis not present

## 2020-09-18 DIAGNOSIS — E039 Hypothyroidism, unspecified: Secondary | ICD-10-CM | POA: Diagnosis not present

## 2020-09-19 LAB — COMPREHENSIVE METABOLIC PANEL
ALT: 16 IU/L (ref 0–44)
AST: 19 IU/L (ref 0–40)
Albumin/Globulin Ratio: 1.7 (ref 1.2–2.2)
Albumin: 4.5 g/dL (ref 4.0–5.0)
Alkaline Phosphatase: 55 IU/L (ref 44–121)
BUN/Creatinine Ratio: 10 (ref 9–20)
BUN: 9 mg/dL (ref 6–20)
Bilirubin Total: 0.5 mg/dL (ref 0.0–1.2)
CO2: 20 mmol/L (ref 20–29)
Calcium: 9.2 mg/dL (ref 8.7–10.2)
Chloride: 102 mmol/L (ref 96–106)
Creatinine, Ser: 0.93 mg/dL (ref 0.76–1.27)
GFR calc Af Amer: 125 mL/min/{1.73_m2} (ref >59)
GFR calc non Af Amer: 108 mL/min/{1.73_m2} (ref >59)
Globulin, Total: 2.7 g/dL (ref 1.5–4.5)
Glucose: 101 mg/dL — ABNORMAL HIGH (ref 65–99)
Potassium: 3.9 mmol/L (ref 3.5–5.2)
Sodium: 139 mmol/L (ref 134–144)
Total Protein: 7.2 g/dL (ref 6.0–8.5)

## 2020-09-19 LAB — CBC
Hematocrit: 46.1 % (ref 37.5–51.0)
Hemoglobin: 16.2 g/dL (ref 13.0–17.7)
MCH: 32.1 pg (ref 26.6–33.0)
MCHC: 35.1 g/dL (ref 31.5–35.7)
MCV: 92 fL (ref 79–97)
Platelets: 250 10*3/uL (ref 150–450)
RBC: 5.04 x10E6/uL (ref 4.14–5.80)
RDW: 11.9 % (ref 11.6–15.4)
WBC: 5.4 10*3/uL (ref 3.4–10.8)

## 2020-09-19 LAB — LIPID PANEL WITH LDL/HDL RATIO
Cholesterol, Total: 193 mg/dL (ref 100–199)
HDL: 58 mg/dL (ref >39)
LDL Chol Calc (NIH): 119 mg/dL — ABNORMAL HIGH (ref 0–99)
LDL/HDL Ratio: 2.1 ratio (ref 0.0–3.6)
Triglycerides: 89 mg/dL (ref 0–149)
VLDL Cholesterol Cal: 16 mg/dL (ref 5–40)

## 2020-09-19 LAB — T4, FREE: Free T4: 1.31 ng/dL (ref 0.82–1.77)

## 2020-09-19 LAB — TSH: TSH: 3.56 u[IU]/mL (ref 0.450–4.500)

## 2020-09-28 ENCOUNTER — Ambulatory Visit (INDEPENDENT_AMBULATORY_CARE_PROVIDER_SITE_OTHER): Payer: BC Managed Care – PPO | Admitting: Hospice and Palliative Medicine

## 2020-09-28 ENCOUNTER — Encounter: Payer: Self-pay | Admitting: Hospice and Palliative Medicine

## 2020-09-28 VITALS — BP 138/76 | HR 80 | Temp 97.9°F | Resp 16 | Ht 69.0 in | Wt 169.4 lb

## 2020-09-28 DIAGNOSIS — R300 Dysuria: Secondary | ICD-10-CM

## 2020-09-28 DIAGNOSIS — F411 Generalized anxiety disorder: Secondary | ICD-10-CM

## 2020-09-28 DIAGNOSIS — E039 Hypothyroidism, unspecified: Secondary | ICD-10-CM | POA: Diagnosis not present

## 2020-09-28 DIAGNOSIS — G4709 Other insomnia: Secondary | ICD-10-CM | POA: Diagnosis not present

## 2020-09-28 MED ORDER — CLONAZEPAM 0.5 MG PO TABS: 0.5000 mg | ORAL_TABLET | Freq: Every evening | ORAL | 0 refills | Status: DC | PRN

## 2020-09-28 MED ORDER — LEVOTHYROXINE SODIUM 50 MCG PO TABS: 50.0000 ug | ORAL_TABLET | Freq: Every day | ORAL | 0 refills | Status: DC

## 2020-09-28 NOTE — Progress Notes (Signed)
[facility] [place], [place] [address]  Internal MEDICINE  Office Visit Note  Patient Name: Lawrence Taylor  [id number]  [id number]  Date of Service: 10/02/2020  Chief Complaint  Patient presents with  . Annual Exam    Refill request     HPI Pt is here for routine health maintenance examination Overall, things have been going well Reviewed his recent labs--glucose level and LDL minimally elevated History of hypothyroidism--thyroid levels within range  Continues to use clonazepam as needed for sleep--has tried multiple different medications in the past for sleep as well as anxiety--unable to tolerate due to negative side effects Not taking clonazepam every night but most nights--without medication he has difficulty falling asleep  No recent changes in his appetite or bowel habits Eats a healthy balanced diet and exercises routinely  Works full-time, not in a current relationship, has one daughter, drinks alcohol socially, denies tobacco use  Current Medication: Outpatient Encounter Medications as of 09/28/2020  Medication Sig  . clonazePAM (KLONOPIN) 0.5 MG tablet Take 1 tablet (0.5 mg total) by mouth at bedtime as needed for anxiety.  Marland Kitchen ibuprofen (ADVIL) 800 MG tablet Take 1 tablet (800 mg total) by mouth every 8 (eight) hours as needed for moderate pain.  Marland Kitchen levothyroxine (SYNTHROID) 50 MCG tablet Take 1 tablet (50 mcg total) by mouth daily before breakfast.  . [DISCONTINUED] clonazePAM (KLONOPIN) 0.5 MG tablet Take 1 tablet (0.5 mg total) by mouth at bedtime as needed for anxiety.  . [DISCONTINUED] clonazePAM (KLONOPIN) 0.5 MG tablet Take 1 tablet (0.5 mg total) by mouth at bedtime as needed for anxiety.  . [DISCONTINUED] levothyroxine (SYNTHROID) 50 MCG tablet Take 1 tablet (50 mcg total) by mouth daily before breakfast.  . [DISCONTINUED] omeprazole (PRILOSEC) 10 MG capsule Take 1 capsule (10 mg total) by mouth daily.  . [DISCONTINUED]  oxyCODONE-acetaminophen (PERCOCET/ROXICET) 5-325 MG tablet Take 1 tablet by mouth every 4 (four) hours as needed for severe pain.   No facility-administered encounter medications on file as of 09/28/2020.    Surgical History: History reviewed. No pertinent surgical history.  Medical History: Past Medical History:  Diagnosis Date  . Thyroid disease     Family History: Family History  Problem Relation Age of Onset  . Kidney cancer Neg Hx   . Prostate cancer Neg Hx   . Bladder Cancer Neg Hx       Review of Systems  Constitutional: Negative for chills, fatigue and unexpected weight change.  HENT: Negative for congestion, postnasal drip, rhinorrhea, sneezing and sore throat.   Eyes: Negative for redness.  Respiratory: Negative for cough, chest tightness and shortness of breath.   Cardiovascular: Negative for chest pain and palpitations.  Gastrointestinal: Negative for abdominal pain, constipation, diarrhea, nausea and vomiting.  Genitourinary: Negative for dysuria and frequency.  Musculoskeletal: Negative for arthralgias, back pain, joint swelling and neck pain.  Skin: Negative for rash.  Neurological: Negative for tremors and numbness.  Hematological: Negative for adenopathy. Does not bruise/bleed easily.  Psychiatric/Behavioral: Positive for sleep disturbance. Negative for behavioral problems (Depression) and suicidal ideas. The patient is not nervous/anxious.      Vital Signs: BP 138/76   Pulse 80   Temp 97.9 F (36.6 C)   Resp 16   Ht '5\' 9"'  (1.753 m)   Wt 169 lb 6.4 oz (76.8 kg)   SpO2 98%   BMI 25.02 kg/m    Physical Exam Vitals reviewed.  Constitutional:      Appearance: Normal appearance.  He is normal weight.  Cardiovascular:     Rate and Rhythm: Normal rate and regular rhythm.     Pulses: Normal pulses.     Heart sounds: Normal heart sounds.  Pulmonary:     Effort: Pulmonary effort is normal.     Breath sounds: Normal breath sounds.  Abdominal:      General: Abdomen is flat.  Musculoskeletal:        General: Normal range of motion.     Cervical back: Normal range of motion.  Skin:    General: Skin is warm.  Neurological:     General: No focal deficit present.     Mental Status: He is alert and oriented to person, place, and time. Mental status is at baseline.  Psychiatric:        Mood and Affect: Mood normal.        Behavior: Behavior normal.        Thought Content: Thought content normal.        Judgment: Judgment normal.    LABS: Recent Results (from the past 2160 hour(s))  Comprehensive metabolic panel     Status: Abnormal   Collection Time: 09/18/20  2:31 PM  Result Value Ref Range   Glucose 101 (H) 65 - 99 mg/dL   BUN 9 6 - 20 mg/dL   Creatinine, Ser 0.93 0.76 - 1.27 mg/dL   GFR calc non Af Amer 108 >59 mL/min/1.73   GFR calc Af Amer 125 >59 mL/min/1.73    Comment: **In accordance with recommendations from the NKF-ASN Task force,**   [facility] is in the process of updating its eGFR calculation to the   2021 CKD-EPI creatinine equation that estimates kidney function   without a race variable.    BUN/Creatinine Ratio 10 9 - 20   Sodium 139 134 - 144 mmol/L   Potassium 3.9 3.5 - 5.2 mmol/L   Chloride 102 96 - 106 mmol/L   CO2 20 20 - 29 mmol/L   Calcium 9.2 8.7 - 10.2 mg/dL   Total Protein 7.2 6.0 - 8.5 g/dL   Albumin 4.5 4.0 - 5.0 g/dL   Globulin, Total 2.7 1.5 - 4.5 g/dL   Albumin/Globulin Ratio 1.7 1.2 - 2.2   Bilirubin Total 0.5 0.0 - 1.2 mg/dL   Alkaline Phosphatase 55 44 - 121 IU/L   AST 19 0 - 40 IU/L   ALT 16 0 - 44 IU/L  CBC     Status: None   Collection Time: 09/18/20  2:31 PM  Result Value Ref Range   WBC 5.4 3.4 - 10.8 x10E3/uL   RBC 5.04 4.14 - 5.80 x10E6/uL   Hemoglobin 16.2 13.0 - 17.7 g/dL   Hematocrit 46.1 37.5 - 51.0 %   MCV 92 79 - 97 fL   MCH 32.1 26.6 - 33.0 pg   MCHC 35.1 31.5 - 35.7 g/dL   RDW 11.9 11.6 - 15.4 %   Platelets 250 150 - 450 x10E3/uL  Lipid Panel With LDL/HDL Ratio      Status: Abnormal   Collection Time: 09/18/20  2:31 PM  Result Value Ref Range   Cholesterol, Total 193 100 - 199 mg/dL   Triglycerides 89 0 - 149 mg/dL   HDL 58 >39 mg/dL   VLDL Cholesterol Cal 16 5 - 40 mg/dL   LDL Chol Calc (NIH) 119 (H) 0 - 99 mg/dL   LDL/HDL Ratio 2.1 0.0 - 3.6 ratio    Comment:  LDL/HDL Ratio                                             Men  Women                               1/2 Avg.Risk  1.0    1.5                                   Avg.Risk  3.6    3.2                                2X Avg.Risk  6.2    5.0                                3X Avg.Risk  8.0    6.1   T4, free     Status: None   Collection Time: 09/18/20  2:31 PM  Result Value Ref Range   Free T4 1.31 0.82 - 1.77 ng/dL  TSH     Status: None   Collection Time: 09/18/20  2:31 PM  Result Value Ref Range   TSH 3.560 0.450 - 4.500 uIU/mL  UA/M w/rflx Culture, Routine     Status: Abnormal   Collection Time: 09/28/20 12:14 PM   Specimen: Urine   Urine  Result Value Ref Range   Specific Gravity, UA 1.028 1.005 - 1.030   pH, UA 6.0 5.0 - 7.5   Color, UA Yellow Yellow   Appearance Ur Turbid (A) Clear   Leukocytes,UA Negative Negative   Protein,UA Negative Negative/Trace   Glucose, UA Negative Negative   Ketones, UA Trace (A) Negative   RBC, UA Negative Negative   Bilirubin, UA Negative Negative   Urobilinogen, Ur 0.2 0.2 - 1.0 mg/dL   Nitrite, UA Negative Negative   Microscopic Examination Comment     Comment: Microscopic follows if indicated.   Microscopic Examination See below:     Comment: Microscopic was indicated and was performed.   Urinalysis Reflex Comment     Comment: This specimen will not reflex to a Urine Culture.  Microscopic Examination     Status: None   Collection Time: 09/28/20 12:14 PM   Urine  Result Value Ref Range   WBC, UA None seen 0 - 5 /hpf   RBC None seen 0 - 2 /hpf   Epithelial Cells (non renal) None seen 0 - 10 /hpf   Casts  None seen None seen /lpf   Bacteria, UA None seen None seen/Few   Assessment/Plan: 1. Acquired hypothyroidism Levels stable, continue with current dose of levothyroxine - levothyroxine (SYNTHROID) 50 MCG tablet; Take 1 tablet (50 mcg total) by mouth daily before breakfast.  Dispense: 90 tablet; Refill: 0  2. Other insomnia Continue with clonazepam as needed Will need UDS at next follow-up Will also discuss CBTi - clonazePAM (KLONOPIN) 0.5 MG tablet; Take 1 tablet (0.5 mg total) by mouth at bedtime as needed for anxiety.  Dispense: 30 tablet; Refill: 0  3. Generalized anxiety disorder Able to control anxiety with exercise--consider trial of SSRI/SNRI  4. Dysuria - UA/M  w/rflx Culture, Routine - Microscopic Examination  General Counseling: Lawrence Taylor verbalizes understanding of the findings of todays visit and agrees with plan of treatment. I have discussed any further diagnostic evaluation that may be needed or ordered today. We also reviewed his medications today. he has been encouraged to call the office with any questions or concerns that should arise related to todays visit.    Counseling: Lawrence Taylor Controlled Substance Database was reviewed by me for overdose risk score (ORS)   Orders Placed This Encounter  Procedures  . Microscopic Examination  . UA/M w/rflx Culture, Routine    Meds ordered this encounter  Medications  . levothyroxine (SYNTHROID) 50 MCG tablet    Sig: Take 1 tablet (50 mcg total) by mouth daily before breakfast.    Dispense:  90 tablet    Refill:  0    Please fill this as 90 day prescription for this. Thanks.  Marland Kitchen DISCONTD: clonazePAM (KLONOPIN) 0.5 MG tablet    Sig: Take 1 tablet (0.5 mg total) by mouth at bedtime as needed for anxiety.    Dispense:  30 tablet    Refill:  0  . clonazePAM (KLONOPIN) 0.5 MG tablet    Sig: Take 1 tablet (0.5 mg total) by mouth at bedtime as needed for anxiety.    Dispense:  30 tablet    Refill:  0    Do not fill before March  4th.    Total time spent: 30 Minutes  Time spent includes review of chart, medications, test results, and follow up plan with the patient.   This patient was seen by Casey Burkitt AGNP-C Collaboration with Dr Lavera Guise as a part of collaborative care agreement   Tanna Furry. [facility]

## 2020-09-29 ENCOUNTER — Ambulatory Visit: Payer: BC Managed Care – PPO | Admitting: Nurse Practitioner

## 2020-09-29 LAB — MICROSCOPIC EXAMINATION
Bacteria, UA: NONE SEEN
Casts: NONE SEEN /lpf
Epithelial Cells (non renal): NONE SEEN /hpf (ref 0–10)
RBC, Urine: NONE SEEN /hpf (ref 0–2)
WBC, UA: NONE SEEN /hpf (ref 0–5)

## 2020-09-29 LAB — UA/M W/RFLX CULTURE, ROUTINE
Bilirubin, UA: NEGATIVE
Glucose, UA: NEGATIVE
Leukocytes,UA: NEGATIVE
Nitrite, UA: NEGATIVE
Protein,UA: NEGATIVE
RBC, UA: NEGATIVE
Specific Gravity, UA: 1.028 (ref 1.005–1.030)
Urobilinogen, Ur: 0.2 mg/dL (ref 0.2–1.0)
pH, UA: 6 (ref 5.0–7.5)

## 2020-10-02 ENCOUNTER — Encounter: Payer: Self-pay | Admitting: Hospice and Palliative Medicine

## 2020-11-24 ENCOUNTER — Encounter: Payer: Self-pay | Admitting: Hospice and Palliative Medicine

## 2020-11-24 ENCOUNTER — Other Ambulatory Visit: Payer: Self-pay

## 2020-11-24 ENCOUNTER — Ambulatory Visit (INDEPENDENT_AMBULATORY_CARE_PROVIDER_SITE_OTHER): Payer: BC Managed Care – PPO | Admitting: Hospice and Palliative Medicine

## 2020-11-24 VITALS — BP 122/74 | HR 82 | Temp 97.6°F | Resp 16 | Ht 68.0 in | Wt 171.0 lb

## 2020-11-24 DIAGNOSIS — Z79899 Other long term (current) drug therapy: Secondary | ICD-10-CM | POA: Diagnosis not present

## 2020-11-24 DIAGNOSIS — G4709 Other insomnia: Secondary | ICD-10-CM

## 2020-11-24 DIAGNOSIS — F5101 Primary insomnia: Secondary | ICD-10-CM | POA: Diagnosis not present

## 2020-11-24 DIAGNOSIS — E039 Hypothyroidism, unspecified: Secondary | ICD-10-CM

## 2020-11-24 LAB — POCT URINE DRUG SCREEN
POC Amphetamine UR: NOT DETECTED
POC BENZODIAZEPINES UR: NOT DETECTED
POC Barbiturate UR: NOT DETECTED
POC Cocaine UR: NOT DETECTED
POC Ecstasy UR: NOT DETECTED
POC Marijuana UR: NOT DETECTED
POC Methadone UR: NOT DETECTED
POC Methamphetamine UR: NOT DETECTED
POC Opiate Ur: NOT DETECTED
POC Oxycodone UR: NOT DETECTED
POC PHENCYCLIDINE UR: NOT DETECTED
POC TRICYCLICS UR: NOT DETECTED

## 2020-11-24 MED ORDER — CLONAZEPAM 0.5 MG PO TABS: 0.5000 mg | ORAL_TABLET | Freq: Every evening | ORAL | 1 refills | Status: DC | PRN

## 2020-11-24 NOTE — Progress Notes (Signed)
[facility] [address] [place], [place] [address]  Internal MEDICINE  Office Visit Note  Patient Name: Lawrence Taylor  [id number]  [id number]  Date of Service: 11/24/2020  Chief Complaint  Patient presents with  . Hypothyroidism  . Follow-up    HPI Patient is here for routine follow-up Continues to work out and remains active daily--aware of food choices and majority of the times eats healthy food options Continues to take clonazepam as needed at night for insomnia--has been tried on multiple different options in the past without success Takes clonazepam 4-5 nights per week  No acute issues or complaints today  Current Medication: Outpatient Encounter Medications as of 11/24/2020  Medication Sig  . ibuprofen (ADVIL) 800 MG tablet Take 1 tablet (800 mg total) by mouth every 8 (eight) hours as needed for moderate pain.  Marland Kitchen levothyroxine (SYNTHROID) 50 MCG tablet Take 1 tablet (50 mcg total) by mouth daily before breakfast.  . [DISCONTINUED] clonazePAM (KLONOPIN) 0.5 MG tablet Take 1 tablet (0.5 mg total) by mouth at bedtime as needed for anxiety.  . clonazePAM (KLONOPIN) 0.5 MG tablet Take 1 tablet (0.5 mg total) by mouth at bedtime as needed for anxiety.   No facility-administered encounter medications on file as of 11/24/2020.    Surgical History: History reviewed. No pertinent surgical history.  Medical History: Past Medical History:  Diagnosis Date  . Thyroid disease     Family History: Family History  Problem Relation Age of Onset  . Kidney cancer Neg Hx   . Prostate cancer Neg Hx   . Bladder Cancer Neg Hx     Social History   Socioeconomic History  . Marital status: Single    Spouse name: Not on file  . Number of children: Not on file  . Years of education: Not on file  . Highest education level: Not on file  Occupational History  . Not on file  Tobacco Use  . Smoking status: Never Smoker  . Smokeless tobacco: Never Used  Substance and  Sexual Activity  . Alcohol use: Yes    Comment: rarley  . Drug use: No  . Sexual activity: Not on file  Other Topics Concern  . Not on file  Social History Narrative  . Not on file   Social Determinants of Health   Financial Resource Strain: Not on file  Food Insecurity: Not on file  Transportation Needs: Not on file  Physical Activity: Not on file  Stress: Not on file  Social Connections: Not on file  Intimate Partner Violence: Not on file      Review of Systems  Constitutional: Negative for chills, fatigue and unexpected weight change.  HENT: Positive for postnasal drip. Negative for congestion, rhinorrhea, sneezing and sore throat.   Eyes: Negative for redness.  Respiratory: Negative for cough, chest tightness and shortness of breath.   Cardiovascular: Negative for chest pain and palpitations.  Gastrointestinal: Negative for abdominal pain, constipation, diarrhea, nausea and vomiting.  Genitourinary: Negative for dysuria and frequency.  Musculoskeletal: Negative for arthralgias, back pain, joint swelling and neck pain.  Skin: Negative for rash.  Neurological: Negative.  Negative for tremors and numbness.  Hematological: Negative for adenopathy. Does not bruise/bleed easily.  Psychiatric/Behavioral: Negative for behavioral problems (Depression), sleep disturbance and suicidal ideas. The patient is not nervous/anxious.     Vital Signs: BP 122/74   Pulse 82   Temp 97.6 F (36.4 C)   Resp 16   Ht 5\' 8"  (1.727 m)  Wt 171 lb (77.6 kg)   SpO2 98%   BMI 26.00 kg/m    Physical Exam Vitals reviewed.  Constitutional:      Appearance: Normal appearance. He is normal weight.  Cardiovascular:     Rate and Rhythm: Normal rate and regular rhythm.     Pulses: Normal pulses.     Heart sounds: Normal heart sounds.  Pulmonary:     Effort: Pulmonary effort is normal.     Breath sounds: Normal breath sounds.  Abdominal:     General: Abdomen is flat.     Palpations:  Abdomen is soft.  Musculoskeletal:        General: Normal range of motion.     Cervical back: Normal range of motion.  Skin:    General: Skin is warm.  Neurological:     General: No focal deficit present.     Mental Status: He is alert and oriented to person, place, and time. Mental status is at baseline.  Psychiatric:        Mood and Affect: Mood normal.        Behavior: Behavior normal.        Thought Content: Thought content normal.        Judgment: Judgment normal.    Assessment/Plan: 1. Acquired hypothyroidism Stable, continue current dosing of levothyroxine  2. Primary insomnia May continue with clonazepam, encouraged to try Melatonin prior to bedtime, continue clonazepam as needed Discuss ONO at next visit [employer] Controlled Substance Database was reviewed by me for overdose risk score (ORS) Reviewed risks and possible side effects associated with taking opiates, benzodiazepines and other CNS depressants. Combination of these could cause dizziness and drowsiness. Advised patient not to drive or operate machinery when taking these medications, as patient's and other's life can be at risk and will have consequences. Patient verbalized understanding in this matter. Dependence and abuse for these drugs will be monitored closely. A Controlled substance policy and procedure is on file which allows [facility] to order a urine drug screen test at any visit. Patient understands and agrees with the plan - clonazePAM (KLONOPIN) 0.5 MG tablet; Take 1 tablet (0.5 mg total) by mouth at bedtime as needed for anxiety.  Dispense: 30 tablet; Refill: 1  3. Encounter for long-term (current) use of medications - POCT Urine Drug Screen  General Counseling: [name] verbalizes understanding of the findings of todays visit and agrees with plan of treatment. I have discussed any further diagnostic evaluation that may be needed or ordered today. We also reviewed his medications today. he has been  encouraged to call the office with any questions or concerns that should arise related to todays visit.    Orders Placed This Encounter  Procedures  . POCT Urine Drug Screen    Meds ordered this encounter  Medications  . clonazePAM (KLONOPIN) 0.5 MG tablet    Sig: Take 1 tablet (0.5 mg total) by mouth at bedtime as needed for anxiety.    Dispense:  30 tablet    Refill:  1    Time spent: 25 Minutes Time spent includes review of chart, medications, test results and follow-up plan with the patient.  This patient was seen by Leeanne Deed AGNP-C in Collaboration with Dr Lyndon Code as a part of collaborative care agreement     Lubertha Basque. Gaylin Osoria AGNP-C Internal medicine

## 2020-11-27 ENCOUNTER — Encounter: Payer: Self-pay | Admitting: Hospice and Palliative Medicine

## 2020-11-30 ENCOUNTER — Other Ambulatory Visit: Payer: Self-pay | Admitting: Hospice and Palliative Medicine

## 2020-11-30 DIAGNOSIS — F5101 Primary insomnia: Secondary | ICD-10-CM

## 2020-12-05 ENCOUNTER — Telehealth: Payer: Self-pay

## 2020-12-05 NOTE — Telephone Encounter (Signed)
Pt called and left a message but when I try to return the call the voice mail has not been set up, and pt doesn't answer. He called regarding refill request but did not leave details.

## 2021-01-23 ENCOUNTER — Ambulatory Visit (INDEPENDENT_AMBULATORY_CARE_PROVIDER_SITE_OTHER): Payer: BC Managed Care – PPO | Admitting: Internal Medicine

## 2021-01-23 ENCOUNTER — Encounter: Payer: Self-pay | Admitting: Internal Medicine

## 2021-01-23 ENCOUNTER — Other Ambulatory Visit: Payer: Self-pay

## 2021-01-23 VITALS — BP 116/80 | HR 73 | Temp 98.8°F | Resp 16 | Ht 68.0 in | Wt 166.2 lb

## 2021-01-23 DIAGNOSIS — E038 Other specified hypothyroidism: Secondary | ICD-10-CM | POA: Diagnosis not present

## 2021-01-23 DIAGNOSIS — F5101 Primary insomnia: Secondary | ICD-10-CM

## 2021-01-23 MED ORDER — CLONAZEPAM 0.5 MG PO TABS: 0.5000 mg | ORAL_TABLET | Freq: Every evening | ORAL | 2 refills | Status: DC | PRN

## 2021-01-23 NOTE — Progress Notes (Signed)
[facility] [address] [place], [place] [address]  Internal MEDICINE  Office Visit Note  Patient Name: Lawrence Taylor  [id number]  [id number]  Date of Service: 01/30/2021  Chief Complaint  Patient presents with  . Follow-up    Med review    HPI Patient is here for routine follow-up. He feels well, continues to have problem with sleeping and the only thing that helps him to fall asleep is low-dose Klonopin, he would like to have a refill on that patient does not exhibit any drug abuse or potential. Patient also have hypothyroidism and is on Synthroid takes his medications regularly    Current Medication: Outpatient Encounter Medications as of 01/23/2021  Medication Sig  . clonazePAM (KLONOPIN) 0.5 MG tablet Take 1 tablet (0.5 mg total) by mouth at bedtime as needed for anxiety.  Marland Kitchen ibuprofen (ADVIL) 800 MG tablet Take 1 tablet (800 mg total) by mouth every 8 (eight) hours as needed for moderate pain.  Marland Kitchen levothyroxine (SYNTHROID) 50 MCG tablet Take 1 tablet (50 mcg total) by mouth daily before breakfast.  . [DISCONTINUED] clonazePAM (KLONOPIN) 0.5 MG tablet Take 1 tablet (0.5 mg total) by mouth at bedtime as needed for anxiety.   No facility-administered encounter medications on file as of 01/23/2021.    Surgical History: History reviewed. No pertinent surgical history.  Medical History: Past Medical History:  Diagnosis Date  . Thyroid disease     Family History: Family History  Problem Relation Age of Onset  . Kidney cancer Neg Hx   . Prostate cancer Neg Hx   . Bladder Cancer Neg Hx     Social History   Socioeconomic History  . Marital status: Single    Spouse name: Not on file  . Number of children: Not on file  . Years of education: Not on file  . Highest education level: Not on file  Occupational History  . Not on file  Tobacco Use  . Smoking status: Never Smoker  . Smokeless tobacco: Never Used  Substance and Sexual Activity  .  Alcohol use: Yes    Comment: rarley  . Drug use: No  . Sexual activity: Not on file  Other Topics Concern  . Not on file  Social History Narrative  . Not on file   Social Determinants of Health   Financial Resource Strain: Not on file  Food Insecurity: Not on file  Transportation Needs: Not on file  Physical Activity: Not on file  Stress: Not on file  Social Connections: Not on file  Intimate Partner Violence: Not on file      Review of Systems  Constitutional: Negative for fatigue and fever.  HENT: Negative for congestion, mouth sores and postnasal drip.   Respiratory: Negative for cough.   Cardiovascular: Negative for chest pain.  Genitourinary: Negative for flank pain.  Psychiatric/Behavioral: The patient is nervous/anxious.     Vital Signs: BP 116/80   Pulse 73   Temp 98.8 F (37.1 C)   Resp 16   Ht 5\' 8"  (1.727 m)   Wt 166 lb 3.2 oz (75.4 kg)   SpO2 97%   BMI 25.27 kg/m    Physical Exam Constitutional:      Appearance: Normal appearance.  HENT:     Head: Normocephalic and atraumatic.     Nose: Nose normal.     Mouth/Throat:     Mouth: Mucous membranes are moist.     Pharynx: No posterior oropharyngeal erythema.  Eyes:  Extraocular Movements: Extraocular movements intact.     Pupils: Pupils are equal, round, and reactive to light.  Cardiovascular:     Pulses: Normal pulses.     Heart sounds: Normal heart sounds.  Pulmonary:     Effort: Pulmonary effort is normal.     Breath sounds: Normal breath sounds.  Neurological:     General: No focal deficit present.     Mental Status: He is alert.  Psychiatric:        Mood and Affect: Mood normal.        Behavior: Behavior normal.        Assessment/Plan: 1. Primary insomnia Refill prescriptions for the next 3 months PDMP was also reviewed - clonazePAM (KLONOPIN) 0.5 MG tablet; Take 1 tablet (0.5 mg total) by mouth at bedtime as needed for anxiety.  Dispense: 30 tablet; Refill: 2  2. Other  specified hypothyroidism Continue Synthroid 50 mcg once a day Will need TSH and free T4 level checked  General Counseling: [name] verbalizes understanding of the findings of todays visit and agrees with plan of treatment. I have discussed any further diagnostic evaluation that may be needed or ordered today. We also reviewed his medications today. he has been encouraged to call the office with any questions or concerns that should arise related to todays visit.   Meds ordered this encounter  Medications  . clonazePAM (KLONOPIN) 0.5 MG tablet    Sig: Take 1 tablet (0.5 mg total) by mouth at bedtime as needed for anxiety.    Dispense:  30 tablet    Refill:  2    Total time spent:  30  Minutes Time spent includes review of chart, medications, test results, and follow up plan with the patient.   [place] Controlled Substance Database was reviewed by me.   Dr Lyndon Code Internal medicine

## 2021-04-26 ENCOUNTER — Ambulatory Visit (INDEPENDENT_AMBULATORY_CARE_PROVIDER_SITE_OTHER): Payer: BC Managed Care – PPO | Admitting: Nurse Practitioner

## 2021-04-26 ENCOUNTER — Encounter: Payer: Self-pay | Admitting: Nurse Practitioner

## 2021-04-26 ENCOUNTER — Other Ambulatory Visit: Payer: Self-pay

## 2021-04-26 VITALS — BP 130/90 | HR 60 | Temp 98.4°F | Resp 16 | Ht 68.0 in | Wt 165.8 lb

## 2021-04-26 DIAGNOSIS — F5101 Primary insomnia: Secondary | ICD-10-CM

## 2021-04-26 DIAGNOSIS — E039 Hypothyroidism, unspecified: Secondary | ICD-10-CM | POA: Diagnosis not present

## 2021-04-26 DIAGNOSIS — Z79899 Other long term (current) drug therapy: Secondary | ICD-10-CM

## 2021-04-26 LAB — POCT URINE DRUG SCREEN
Methylenedioxyamphetamine: NOT DETECTED
POC Amphetamine UR: NOT DETECTED
POC BENZODIAZEPINES UR: NOT DETECTED
POC Barbiturate UR: NOT DETECTED
POC Cocaine UR: NOT DETECTED
POC Ecstasy UR: NOT DETECTED
POC Marijuana UR: NOT DETECTED
POC Methadone UR: NOT DETECTED
POC Methamphetamine UR: NOT DETECTED
POC Opiate Ur: NOT DETECTED
POC Oxycodone UR: NOT DETECTED
POC PHENCYCLIDINE UR: NOT DETECTED
POC TRICYCLICS UR: NOT DETECTED

## 2021-04-26 MED ORDER — LEVOTHYROXINE SODIUM 50 MCG PO TABS: 50.0000 ug | ORAL_TABLET | Freq: Every day | ORAL | 0 refills | Status: DC

## 2021-04-26 MED ORDER — CLONAZEPAM 0.5 MG PO TABS: 0.5000 mg | ORAL_TABLET | Freq: Every evening | ORAL | 0 refills | Status: DC | PRN

## 2021-04-26 MED ORDER — CLONAZEPAM 0.5 MG PO TABS: 0.5000 mg | ORAL_TABLET | Freq: Every evening | ORAL | 2 refills | Status: DC | PRN

## 2021-04-26 NOTE — Progress Notes (Signed)
[facility] [address] [place], [place] [address]  Internal MEDICINE  Office Visit Note  Patient Name: Lawrence Taylor  [id number]  [id number]  Date of Service: 04/26/2021  Chief Complaint  Patient presents with   Follow-up    refills   Hypothyroidism    HPI Lawrence Taylor presents for a follow up visit for hypothyroidism, insomnia and medication refills. He works third shift and takes clonazepam to help him sleep. He also takes levothyroxine and is requesting refills for that. He denies any pain and has no other concerns or questions. UDS is due today. ORS 110   Current Medication: Outpatient Encounter Medications as of 04/26/2021  Medication Sig   ibuprofen (ADVIL) 800 MG tablet Take 1 tablet (800 mg total) by mouth every 8 (eight) hours as needed for moderate pain.   [DISCONTINUED] clonazePAM (KLONOPIN) 0.5 MG tablet Take 1 tablet (0.5 mg total) by mouth at bedtime as needed for anxiety.   [DISCONTINUED] levothyroxine (SYNTHROID) 50 MCG tablet Take 1 tablet (50 mcg total) by mouth daily before breakfast.   clonazePAM (KLONOPIN) 0.5 MG tablet Take 1 tablet (0.5 mg total) by mouth at bedtime as needed for anxiety.   levothyroxine (SYNTHROID) 50 MCG tablet Take 1 tablet (50 mcg total) by mouth daily before breakfast.   No facility-administered encounter medications on file as of 04/26/2021.    Surgical History: History reviewed. No pertinent surgical history.  Medical History: Past Medical History:  Diagnosis Date   Thyroid disease     Family History: Family History  Problem Relation Age of Onset   Kidney cancer Neg Hx    Prostate cancer Neg Hx    Bladder Cancer Neg Hx     Social History   Socioeconomic History   Marital status: Single    Spouse name: Not on file   Number of children: Not on file   Years of education: Not on file   Highest education level: Not on file  Occupational History   Not on file  Tobacco Use   Smoking status: Never    Smokeless tobacco: Never  Substance and Sexual Activity   Alcohol use: Yes    Comment: rarley   Drug use: No   Sexual activity: Not on file  Other Topics Concern   Not on file  Social History Narrative   Not on file   Social Determinants of Health   Financial Resource Strain: Not on file  Food Insecurity: Not on file  Transportation Needs: Not on file  Physical Activity: Not on file  Stress: Not on file  Social Connections: Not on file  Intimate Partner Violence: Not on file      Review of Systems  Constitutional:  Negative for chills, fatigue and unexpected weight change.  HENT:  Negative for congestion, rhinorrhea, sneezing and sore throat.   Eyes:  Negative for redness.  Respiratory:  Negative for cough, chest tightness and shortness of breath.   Cardiovascular:  Negative for chest pain and palpitations.  Gastrointestinal:  Negative for abdominal pain, constipation, diarrhea, nausea and vomiting.  Genitourinary:  Negative for dysuria and frequency.  Musculoskeletal:  Negative for arthralgias, back pain, joint swelling and neck pain.  Skin:  Negative for rash.  Neurological: Negative.  Negative for tremors and numbness.  Hematological:  Negative for adenopathy. Does not bruise/bleed easily.  Psychiatric/Behavioral:  Negative for behavioral problems (Depression), sleep disturbance and suicidal ideas. The patient is not nervous/anxious.    Vital Signs: BP 130/90 Comment: 134/96  Pulse  60   Temp 98.4 F (36.9 C)   Resp 16   Ht 5\' 8"  (1.727 m)   Wt 165 lb 12.8 oz (75.2 kg)   SpO2 98%   BMI 25.21 kg/m    Physical Exam Vitals reviewed.  Constitutional:      Appearance: Normal appearance. He is normal weight.  HENT:     Head: Normocephalic and atraumatic.  Eyes:     Extraocular Movements: Extraocular movements intact.     Pupils: Pupils are equal, round, and reactive to light.  Cardiovascular:     Rate and Rhythm: Normal rate and regular rhythm.  Pulmonary:      Effort: Pulmonary effort is normal. No respiratory distress.  Neurological:     Mental Status: He is alert and oriented to person, place, and time.     Cranial Nerves: No cranial nerve deficit.     Coordination: Coordination normal.     Gait: Gait normal.  Psychiatric:        Mood and Affect: Mood normal.        Behavior: Behavior normal.     Assessment/Plan: 1. Acquired hypothyroidism Stable with current dose, continue as prescribed, refill ordered - levothyroxine (SYNTHROID) 50 MCG tablet; Take 1 tablet (50 mcg total) by mouth daily before breakfast.  Dispense: 90 tablet; Refill: 0  2. Encounter for long-term (current) use of medications UDS done today - POCT Urine Drug Screen  3. Primary insomnia Current medication and dose effective per patient report, Clonazepam refilled.  - clonazePAM (KLONOPIN) 0.5 MG tablet; Take 1 tablet (0.5 mg total) by mouth at bedtime as needed for anxiety.  Dispense: 30 tablet; Refill: 2   General Counseling: Lawrence Taylor verbalizes understanding of the findings of todays visit and agrees with plan of treatment. I have discussed any further diagnostic evaluation that may be needed or ordered today. We also reviewed his medications today. he has been encouraged to call the office with any questions or concerns that should arise related to todays visit.    Orders Placed This Encounter  Procedures   POCT Urine Drug Screen    Meds ordered this encounter  Medications   levothyroxine (SYNTHROID) 50 MCG tablet    Sig: Take 1 tablet (50 mcg total) by mouth daily before breakfast.    Dispense:  90 tablet    Refill:  0    Please fill this as 90 day prescription for this. Thanks.   clonazePAM (KLONOPIN) 0.5 MG tablet    Sig: Take 1 tablet (0.5 mg total) by mouth at bedtime as needed for anxiety.    Dispense:  30 tablet    Refill:  2    Return in about 3 months (around 07/26/2021) for F/U, med refill, Lawrence Taylor PCP.   Total time spent:15 Minutes Time  spent includes review of chart, medications, test results, and follow up plan with the patient.   [place] Controlled Substance Database was reviewed by me.  This patient was seen by 14/08/2020, FNP-C in collaboration with Dr. Sallyanne Kuster as a part of collaborative care agreement.   Sieanna Vanstone R. Beverely Risen, MSN, FNP-C Internal medicine

## 2021-06-24 IMAGING — CR DG FINGER THUMB 2+V*L*
3 series · 3 of 3 positions shown · non-contrast
Comparison: None.

CLINICAL DATA: Left thumb pain after injury. Fall most crushed by
piece of machinery.

EXAM:
LEFT THUMB 2+V

[finger ap]
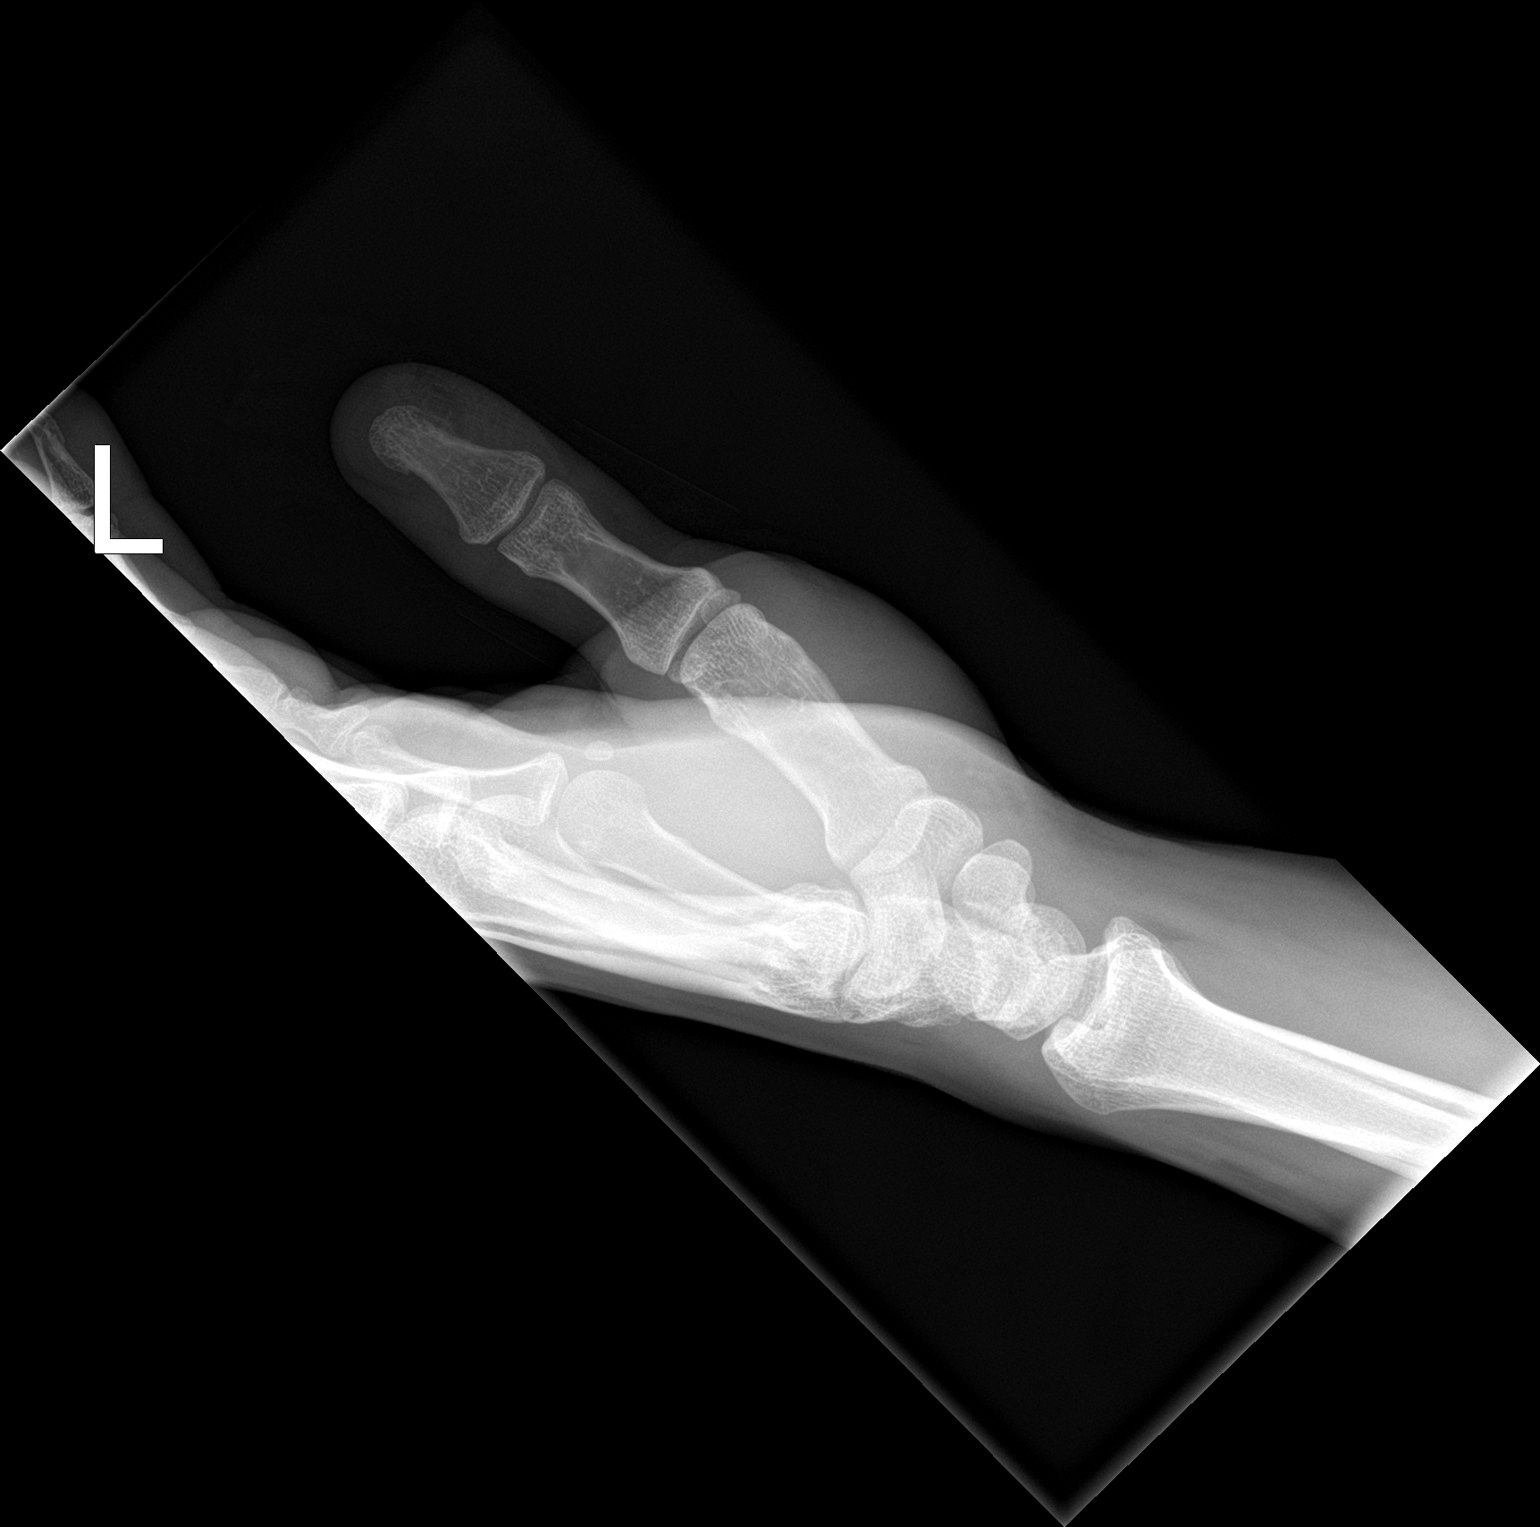

[finger obl]
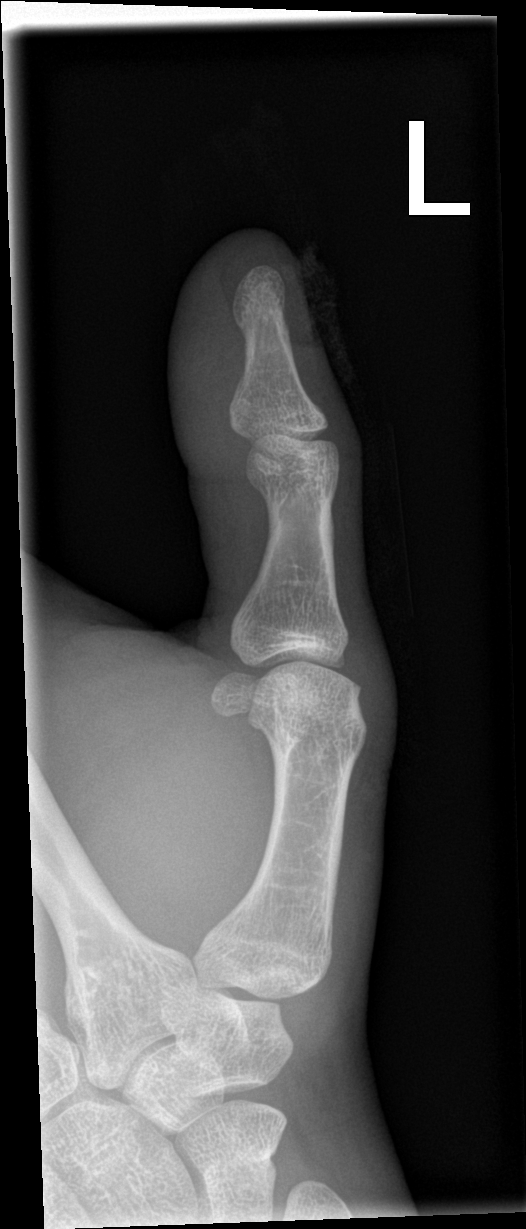

[finger lat]
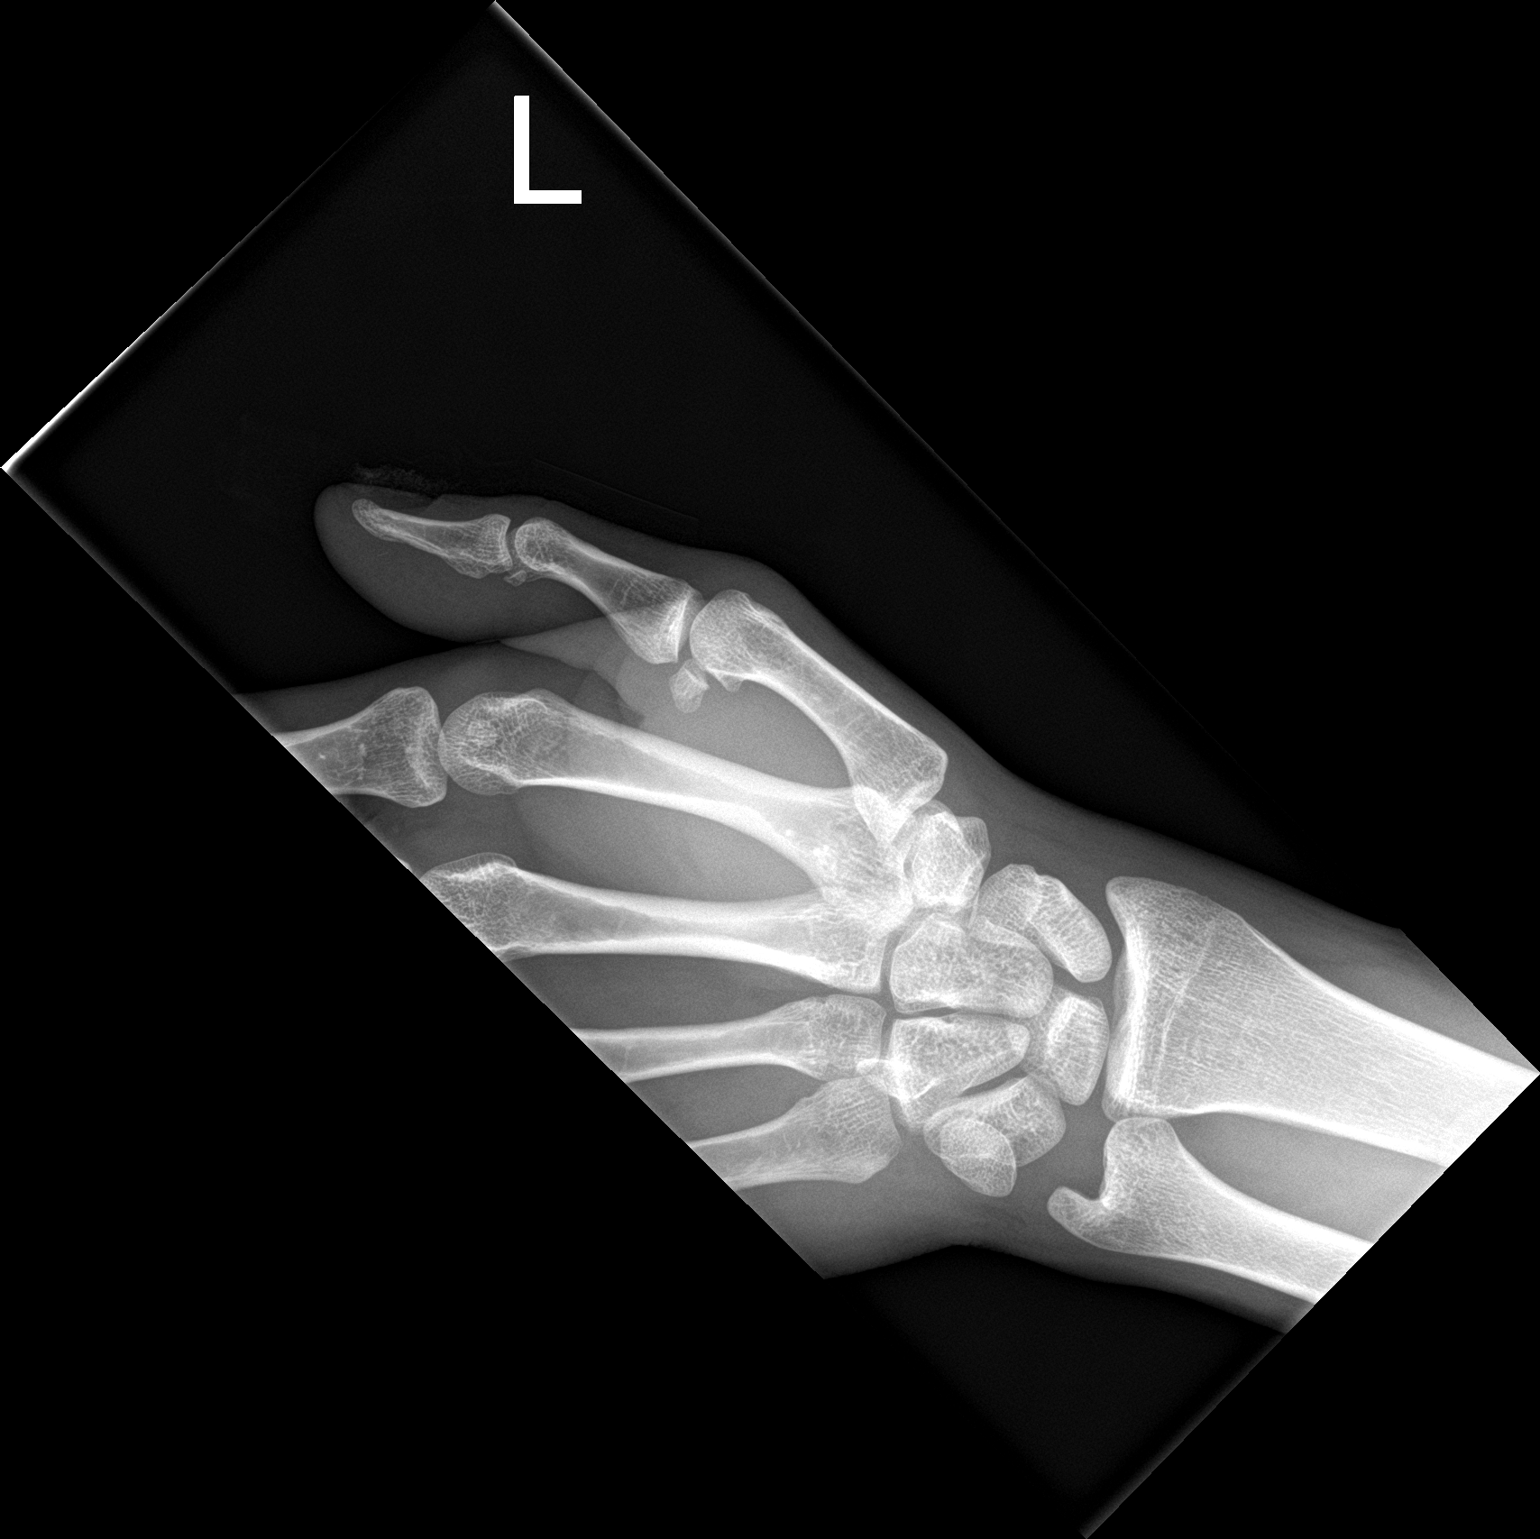

[3 of 3 positions shown; findings below may reference images not displayed]

FINDINGS: There is no evidence of fracture or dislocation. There is no
evidence of arthropathy or other focal bone abnormality. Soft tissue
edema about the distal digit with overlying dressing in place. No
soft tissue air or radiopaque foreign body.
IMPRESSION: Soft tissue edema about the distal digit. No osseous abnormality.

## 2021-07-21 ENCOUNTER — Other Ambulatory Visit: Payer: Self-pay | Admitting: Nurse Practitioner

## 2021-07-21 DIAGNOSIS — E039 Hypothyroidism, unspecified: Secondary | ICD-10-CM

## 2021-07-26 ENCOUNTER — Encounter: Payer: Self-pay | Admitting: Nurse Practitioner

## 2021-07-26 ENCOUNTER — Ambulatory Visit (INDEPENDENT_AMBULATORY_CARE_PROVIDER_SITE_OTHER): Payer: BC Managed Care – PPO | Admitting: Nurse Practitioner

## 2021-07-26 ENCOUNTER — Other Ambulatory Visit: Payer: Self-pay

## 2021-07-26 VITALS — BP 122/69 | HR 70 | Temp 98.3°F | Resp 16 | Ht 68.0 in | Wt 167.0 lb

## 2021-07-26 DIAGNOSIS — Z1339 Encounter for screening examination for other mental health and behavioral disorders: Secondary | ICD-10-CM

## 2021-07-26 DIAGNOSIS — F5101 Primary insomnia: Secondary | ICD-10-CM

## 2021-07-26 DIAGNOSIS — Z0189 Encounter for other specified special examinations: Secondary | ICD-10-CM

## 2021-07-26 DIAGNOSIS — Z1389 Encounter for screening for other disorder: Secondary | ICD-10-CM | POA: Diagnosis not present

## 2021-07-26 MED ORDER — CLONAZEPAM 0.5 MG PO TABS: 0.5000 mg | ORAL_TABLET | Freq: Every evening | ORAL | 2 refills | Status: DC | PRN

## 2021-07-26 NOTE — Progress Notes (Signed)
[facility] [address] [place], [place] [address]  Internal MEDICINE  Office Visit Note  Patient Name: Lawrence Taylor  [id number]  [id number]  Date of Service: 07/26/2021  Chief Complaint  Patient presents with   Follow-up   Medication Refill    HPI Lawrence Taylor presents for a follow-up visit for follow up for medication refills. He is having an issue with someone taking him to court regarding drug and substance use. He states that he needs records showing his drug screens, alcohol use and tobacco use. He was provided with a printout of his last 3 urine drug screens and his alcohol screening from today.  He also wants to show proof that he is not using tobacco products.  He has a prescription for clonazepam which is a benzo. He does need refills. He only takes this medication to help him sleep as needed.     Current Medication: Outpatient Encounter Medications as of 07/26/2021  Medication Sig   ibuprofen (ADVIL) 800 MG tablet Take 1 tablet (800 mg total) by mouth every 8 (eight) hours as needed for moderate pain.   levothyroxine (SYNTHROID) 50 MCG tablet TAKE 1 TABLET BY MOUTH DAILY BEFORE BREAKFAST   [DISCONTINUED] clonazePAM (KLONOPIN) 0.5 MG tablet Take 1 tablet (0.5 mg total) by mouth at bedtime as needed for anxiety.   clonazePAM (KLONOPIN) 0.5 MG tablet Take 1 tablet (0.5 mg total) by mouth at bedtime as needed for anxiety.   No facility-administered encounter medications on file as of 07/26/2021.    Surgical History: History reviewed. No pertinent surgical history.  Medical History: Past Medical History:  Diagnosis Date   Thyroid disease     Family History: Family History  Problem Relation Age of Onset   Kidney cancer Neg Hx    Prostate cancer Neg Hx    Bladder Cancer Neg Hx     Social History   Socioeconomic History   Marital status: Single    Spouse name: Not on file   Number of children: Not on file   Years of education: Not on file    Highest education level: Not on file  Occupational History   Not on file  Tobacco Use   Smoking status: Never   Smokeless tobacco: Never  Substance and Sexual Activity   Alcohol use: Yes    Comment: rarley   Drug use: No   Sexual activity: Not on file  Other Topics Concern   Not on file  Social History Narrative   Not on file   Social Determinants of Health   Financial Resource Strain: Not on file  Food Insecurity: Not on file  Transportation Needs: Not on file  Physical Activity: Not on file  Stress: Not on file  Social Connections: Not on file  Intimate Partner Violence: Not on file      Review of Systems  Constitutional:  Negative for chills, fatigue and unexpected weight change.  HENT:  Negative for congestion, rhinorrhea, sneezing and sore throat.   Eyes:  Negative for redness.  Respiratory:  Negative for cough, chest tightness and shortness of breath.   Cardiovascular:  Negative for chest pain and palpitations.  Gastrointestinal:  Negative for abdominal pain, constipation, diarrhea, nausea and vomiting.  Genitourinary:  Negative for dysuria and frequency.  Musculoskeletal:  Negative for arthralgias, back pain, joint swelling and neck pain.  Skin:  Negative for rash.  Neurological: Negative.  Negative for tremors and numbness.  Hematological:  Negative for adenopathy. Does not bruise/bleed easily.  Psychiatric/Behavioral:  Negative for behavioral problems (Depression), sleep disturbance and suicidal ideas. The patient is not nervous/anxious.    Vital Signs: BP 122/69   Pulse 70   Temp 98.3 F (36.8 C)   Resp 16   Ht 5\' 8"  (1.727 m)   Wt 167 lb (75.8 kg)   SpO2 99%   BMI 25.39 kg/m    Physical Exam Vitals reviewed.  Constitutional:      General: He is not in acute distress.    Appearance: Normal appearance. He is normal weight. He is not ill-appearing.  HENT:     Head: Normocephalic and atraumatic.  Eyes:     Pupils: Pupils are equal, round, and  reactive to light.  Cardiovascular:     Rate and Rhythm: Normal rate and regular rhythm.  Pulmonary:     Effort: Pulmonary effort is normal. No respiratory distress.  Neurological:     Mental Status: He is alert and oriented to person, place, and time.     Cranial Nerves: No cranial nerve deficit.     Coordination: Coordination normal.     Gait: Gait normal.  Psychiatric:        Mood and Affect: Mood normal.        Behavior: Behavior normal.       Assessment/Plan: 1. Encounter for screening for tobacco use Urine specimen obtained for nicotine screening.  - Nicotine screen, urine  2. Primary insomnia Clonazepam refills sent to pharmacy. - clonazePAM (KLONOPIN) 0.5 MG tablet; Take 1 tablet (0.5 mg total) by mouth at bedtime as needed for anxiety.  Dispense: 30 tablet; Refill: 2  3. Screening for alcoholism Screening for alcohol use AUDIT-C printed and given to patient for records, patient does not need ethanol blood test now but patient was instructed to call if he needs the blood test and it can be ordered.    General Counseling: Lawrence Taylor verbalizes understanding of the findings of todays visit and agrees with plan of treatment. I have discussed any further diagnostic evaluation that may be needed or ordered today. We also reviewed his medications today. he has been encouraged to call the office with any questions or concerns that should arise related to todays visit.    Orders Placed This Encounter  Procedures   Nicotine screen, urine    Meds ordered this encounter  Medications   clonazePAM (KLONOPIN) 0.5 MG tablet    Sig: Take 1 tablet (0.5 mg total) by mouth at bedtime as needed for anxiety.    Dispense:  30 tablet    Refill:  2    Return in about 3 months (around 10/24/2021) for F/U, anxiety med refill, Madai Nuccio PCP. Need repeat UDS at next office visit.    Total time spent:30 Minutes Time spent includes review of chart, medications, test results, and follow up plan  with the patient.   [place] Controlled Substance Database was reviewed by me.  This patient was seen by 12/24/2021, FNP-C in collaboration with Dr. Sallyanne Kuster as a part of collaborative care agreement.   Omair Dettmer R. Beverely Risen, MSN, FNP-C Internal medicine

## 2021-07-27 LAB — NICOTINE SCREEN, URINE: Cotinine Ql Scrn, Ur: NEGATIVE ng/mL

## 2021-08-08 NOTE — Progress Notes (Signed)
Unable to [facility], voice mail not set up

## 2021-08-08 NOTE — Progress Notes (Signed)
Please call patient and let him know that his nicotine screening was negative. If he needs a copy of the results, he can come to the office to pick it up or it can be mailed to him if he prefers.

## 2021-08-10 NOTE — Progress Notes (Signed)
Unable to [facility], pt needs to know that his nicotine screening was neg, and if he wants to pickup results or have them mailed he can let us know.

## 2021-08-29 ENCOUNTER — Telehealth: Payer: Self-pay

## 2021-08-29 NOTE — Progress Notes (Signed)
Unable to [facility] voice mail box is not set up, pt needs to know nicotine screening was negative. If he needs a copy of the results, he can come to the office to pick it up or it can be mailed to him if he prefers.

## 2021-09-20 ENCOUNTER — Encounter: Payer: Self-pay | Admitting: Nurse Practitioner

## 2021-10-18 ENCOUNTER — Ambulatory Visit: Payer: BLUE CROSS/BLUE SHIELD | Admitting: Nurse Practitioner

## 2021-10-18 ENCOUNTER — Other Ambulatory Visit: Payer: Self-pay | Admitting: Nurse Practitioner

## 2021-10-18 DIAGNOSIS — E039 Hypothyroidism, unspecified: Secondary | ICD-10-CM

## 2021-10-19 ENCOUNTER — Encounter: Payer: BLUE CROSS/BLUE SHIELD | Admitting: Nurse Practitioner

## 2021-10-22 ENCOUNTER — Telehealth: Payer: Self-pay

## 2021-10-22 DIAGNOSIS — F5101 Primary insomnia: Secondary | ICD-10-CM

## 2021-10-23 MED ORDER — CLONAZEPAM 0.5 MG PO TABS: 0.5000 mg | ORAL_TABLET | Freq: Every evening | ORAL | 0 refills | Status: DC | PRN

## 2021-10-23 NOTE — Telephone Encounter (Signed)
Clonazepam for 8 days sent to pharmacy, must have office visit for further refills. Need UDS as well.

## 2021-10-23 NOTE — Telephone Encounter (Signed)
Try to call voice mail full

## 2021-10-31 ENCOUNTER — Ambulatory Visit (INDEPENDENT_AMBULATORY_CARE_PROVIDER_SITE_OTHER): Payer: Self-pay | Admitting: Nurse Practitioner

## 2021-10-31 ENCOUNTER — Encounter: Payer: Self-pay | Admitting: Nurse Practitioner

## 2021-10-31 ENCOUNTER — Other Ambulatory Visit: Payer: Self-pay

## 2021-10-31 VITALS — BP 116/55 | HR 76 | Temp 98.3°F | Resp 16 | Ht 68.0 in | Wt 172.0 lb

## 2021-10-31 DIAGNOSIS — E559 Vitamin D deficiency, unspecified: Secondary | ICD-10-CM

## 2021-10-31 DIAGNOSIS — Z0001 Encounter for general adult medical examination with abnormal findings: Secondary | ICD-10-CM

## 2021-10-31 DIAGNOSIS — F5101 Primary insomnia: Secondary | ICD-10-CM

## 2021-10-31 DIAGNOSIS — E782 Mixed hyperlipidemia: Secondary | ICD-10-CM

## 2021-10-31 DIAGNOSIS — E039 Hypothyroidism, unspecified: Secondary | ICD-10-CM

## 2021-10-31 DIAGNOSIS — G4709 Other insomnia: Secondary | ICD-10-CM

## 2021-10-31 MED ORDER — CLONAZEPAM 0.5 MG PO TABS: 0.5000 mg | ORAL_TABLET | Freq: Every evening | ORAL | 0 refills | Status: DC | PRN

## 2021-10-31 NOTE — Progress Notes (Cosign Needed)
[facility] [place], [place] [address]  Internal MEDICINE  Office Visit Note  Patient Name: Lawrence Taylor  [id number]  [id number]  Date of Service: 10/31/2021  Chief Complaint  Patient presents with   Annual Exam    HPI Lawrence Taylor presents for an annual well visit and physical exam.  He is a well-appearing 36 year old male with insomnia and hypothyroidism but no other significant medical problems.  He recently returned from [place] where he was working on a house.  Patient is considering returning [place] for approximately 6 months and is requesting to have a 80-monthsupply of his clonazepam and levothyroxine when he decides to go.  This will be discussed with Dr. FClayborn Bigness  Patient reports he has left his job and is taking a break were resolved in his favor but did cause a lot of increased stress in his life and incidentally trouble sleeping as well.  Patient is a non-smoker and he denies any alcohol use or recreational drug use.  He is physically active on a regular basis and tries his best to eat a healthy diet every day.  He has no specific concerns today.  He denies any pain.   Current Medication: Outpatient Encounter Medications as of 10/31/2021  Medication Sig   ibuprofen (ADVIL) 800 MG tablet Take 1 tablet (800 mg total) by mouth every 8 (eight) hours as needed for moderate pain.   levothyroxine (SYNTHROID) 50 MCG tablet TAKE 1 TABLET BY MOUTH EVERY DAY BEFORE BREAKFAST   [DISCONTINUED] clonazePAM (KLONOPIN) 0.5 MG tablet Take 1 tablet (0.5 mg total) by mouth at bedtime as needed for anxiety.   clonazePAM (KLONOPIN) 0.5 MG tablet Take 1 tablet (0.5 mg total) by mouth at bedtime as needed for anxiety.   No facility-administered encounter medications on file as of 10/31/2021.    Surgical History: History reviewed. No pertinent surgical history.  Medical History: Past Medical History:  Diagnosis Date   Thyroid disease     Family  History: Family History  Problem Relation Age of Onset   Kidney cancer Neg Hx    Prostate cancer Neg Hx    Bladder Cancer Neg Hx     Social History   Socioeconomic History   Marital status: Single    Spouse name: Not on file   Number of children: Not on file   Years of education: Not on file   Highest education level: Not on file  Occupational History   Not on file  Tobacco Use   Smoking status: Never   Smokeless tobacco: Never  Substance and Sexual Activity   Alcohol use: Not Currently    Comment: rarley   Drug use: No   Sexual activity: Not on file  Other Topics Concern   Not on file  Social History Narrative   Not on file   Social Determinants of Health   Financial Resource Strain: Not on file  Food Insecurity: Not on file  Transportation Needs: Not on file  Physical Activity: Not on file  Stress: Not on file  Social Connections: Not on file  Intimate Partner Violence: Not on file      Review of Systems  Constitutional:  Negative for activity change, appetite change, chills, fatigue, fever and unexpected weight change.  HENT: Negative.  Negative for congestion, ear pain, rhinorrhea, sore throat and trouble swallowing.   Eyes: Negative.   Respiratory: Negative.  Negative for cough, chest tightness, shortness of breath and wheezing.   Cardiovascular: Negative.  Negative for chest pain.  Gastrointestinal: Negative.  Negative for abdominal pain, blood in stool, constipation, diarrhea, nausea and vomiting.  Endocrine: Negative.   Genitourinary: Negative.  Negative for difficulty urinating, dysuria, frequency, hematuria and urgency.  Musculoskeletal: Negative.  Negative for arthralgias, back pain, joint swelling, myalgias and neck pain.  Skin: Negative.  Negative for rash and wound.  Allergic/Immunologic: Negative.  Negative for immunocompromised state.  Neurological: Negative.  Negative for dizziness, seizures, numbness and headaches.  Hematological: Negative.    Psychiatric/Behavioral: Negative.  Negative for behavioral problems, self-injury and suicidal ideas. The patient is not nervous/anxious.    Vital Signs: BP (!) 116/55    Pulse 76    Temp 98.3 F (36.8 C)    Resp 16    Ht 5' 8" (1.727 m)    Wt 172 lb (78 kg)    SpO2 98%    BMI 26.15 kg/m    Physical Exam Vitals reviewed.  Constitutional:      General: He is not in acute distress.    Appearance: Normal appearance. He is well-developed and normal weight. He is not ill-appearing or diaphoretic.  HENT:     Head: Normocephalic and atraumatic.     Right Ear: Tympanic membrane, ear canal and external ear normal.     Left Ear: Tympanic membrane, ear canal and external ear normal.     Nose: Nose normal. No congestion or rhinorrhea.     Mouth/Throat:     Mouth: Mucous membranes are moist.     Pharynx: No oropharyngeal exudate.  Eyes:     General: No scleral icterus.       Right eye: No discharge.        Left eye: No discharge.     Conjunctiva/sclera: Conjunctivae normal.     Pupils: Pupils are equal, round, and reactive to light.  Neck:     Thyroid: No thyromegaly.     Vascular: No JVD.     Trachea: No tracheal deviation.  Cardiovascular:     Rate and Rhythm: Normal rate and regular rhythm.     Heart sounds: Normal heart sounds. No murmur heard.   No friction rub. No gallop.  Pulmonary:     Effort: Pulmonary effort is normal. No respiratory distress.     Breath sounds: Normal breath sounds. No stridor. No wheezing or rales.  Chest:     Chest wall: No tenderness.  Abdominal:     General: Bowel sounds are normal. There is no distension.     Palpations: Abdomen is soft. There is no mass.     Tenderness: There is no abdominal tenderness. There is no guarding or rebound.  Musculoskeletal:        General: No tenderness or deformity. Normal range of motion.     Cervical back: Normal range of motion and neck supple.  Lymphadenopathy:     Cervical: No cervical adenopathy.  Skin:     General: Skin is warm and dry.     Capillary Refill: Capillary refill takes less than 2 seconds.     Coloration: Skin is not pale.     Findings: No erythema or rash.  Neurological:     Mental Status: He is alert and oriented to person, place, and time.     Cranial Nerves: No cranial nerve deficit.     Motor: No abnormal muscle tone.     Coordination: Coordination normal.     Deep Tendon Reflexes: Reflexes are normal and symmetric.  Psychiatric:  Behavior: Behavior normal.        Thought Content: Thought content normal.        Judgment: Judgment normal.       Assessment/Plan: 1. Encounter for routine adult health examination with abnormal findings Age-appropriate preventive screenings and vaccinations discussed, annual physical exam completed. Routine labs for health maintenance ordered, see below. PHM updated.  - CBC with Differential/Platelet - CMP14+EGFR  2. Acquired hypothyroidism Routine labs ordered.  - CBC with Differential/Platelet - CMP14+EGFR - TSH + free T4  3. Other insomnia 3 month refill of clonazepam ordered. Routine labs ordered. Will need a follow up office visit for additional refills. Patient is requesting a 6 month refill if he decides to visit [place] for 6 months. This will be discussed with Dr. Clayborn Bigness prior to his next office visit.  - CBC with Differential/Platelet - CMP14+EGFR - clonazePAM (KLONOPIN) 0.5 MG tablet; Take 1 tablet (0.5 mg total) by mouth at bedtime as needed for anxiety.  Dispense: 90 tablet; Refill: 0  4. Vitamin D deficiency Routine labs ordered - CBC with Differential/Platelet - CMP14+EGFR - Vitamin D (25 hydroxy)  5. Mixed hyperlipidemia Routine labs ordered - Lipid Profile - CBC with Differential/Platelet - CMP14+EGFR      General Counseling: Surafel verbalizes understanding of the findings of todays visit and agrees with plan of treatment. I have discussed any further diagnostic evaluation that may be  needed or ordered today. We also reviewed his medications today. he has been encouraged to call the office with any questions or concerns that should arise related to todays visit.    Orders Placed This Encounter  Procedures   Lipid Profile   CBC with Differential/Platelet   CMP14+EGFR   TSH + free T4   Vitamin D (25 hydroxy)    Meds ordered this encounter  Medications   clonazePAM (KLONOPIN) 0.5 MG tablet    Sig: Take 1 tablet (0.5 mg total) by mouth at bedtime as needed for anxiety.    Dispense:  90 tablet    Refill:  0    Return in about 3 months (around 01/31/2022) for F/U, med refill,  PCP.   Total time spent:30 Minutes Time spent includes review of chart, medications, test results, and follow up plan with the patient.   [place] Controlled Substance Database was reviewed by me.  This patient was seen by Jonetta Osgood, FNP-C in collaboration with Dr. Clayborn Bigness as a part of collaborative care agreement.   R. Valetta Fuller, MSN, FNP-C Internal medicine

## 2022-01-17 ENCOUNTER — Other Ambulatory Visit: Payer: Self-pay | Admitting: Nurse Practitioner

## 2022-01-17 DIAGNOSIS — E039 Hypothyroidism, unspecified: Secondary | ICD-10-CM

## 2022-01-28 ENCOUNTER — Encounter: Payer: Self-pay | Admitting: Nurse Practitioner

## 2022-01-28 ENCOUNTER — Ambulatory Visit: Payer: Self-pay | Admitting: Nurse Practitioner

## 2022-01-28 VITALS — BP 115/60 | HR 67 | Temp 98.7°F | Resp 16 | Ht 68.0 in | Wt 172.4 lb

## 2022-01-28 DIAGNOSIS — F411 Generalized anxiety disorder: Secondary | ICD-10-CM

## 2022-01-28 DIAGNOSIS — E039 Hypothyroidism, unspecified: Secondary | ICD-10-CM

## 2022-01-28 DIAGNOSIS — G4709 Other insomnia: Secondary | ICD-10-CM

## 2022-01-28 MED ORDER — CLONAZEPAM 0.5 MG PO TABS: 0.5000 mg | ORAL_TABLET | Freq: Every evening | ORAL | 0 refills | Status: DC | PRN

## 2022-01-28 NOTE — Progress Notes (Signed)
[facility] [place], [place] [address]  Internal MEDICINE  Office Visit Note  Patient Name: Lawrence Taylor  [id number]  [id number]  Date of Service: 01/28/2022  Chief Complaint  Patient presents with   Follow-up    HPI Lawrence Taylor presents for follow up for hypothyroidism, insomnia and anxiety. He takes levothyroxine 50 mcg for hypothyroidism, He denies any adverse side effects of the medication and has not noticed any overt symptoms of hypothyroidism. May need to recheck thyroid levels.  He needs clonazepam refills. He decided not to go to [place] so he will not need a 6 month supply anytime soon that he is aware of.  Takes ibuprofen 800 mg as needed for pain but has not needed this recently.     Current Medication: Outpatient Encounter Medications as of 01/28/2022  Medication Sig   ibuprofen (ADVIL) 800 MG tablet Take 1 tablet (800 mg total) by mouth every 8 (eight) hours as needed for moderate pain.   levothyroxine (SYNTHROID) 50 MCG tablet TAKE 1 TABLET BY MOUTH EVERY DAY BEFORE BREAKFAST   [DISCONTINUED] clonazePAM (KLONOPIN) 0.5 MG tablet Take 1 tablet (0.5 mg total) by mouth at bedtime as needed for anxiety.   clonazePAM (KLONOPIN) 0.5 MG tablet Take 1 tablet (0.5 mg total) by mouth at bedtime as needed for anxiety.   No facility-administered encounter medications on file as of 01/28/2022.    Surgical History: History reviewed. No pertinent surgical history.  Medical History: Past Medical History:  Diagnosis Date   Thyroid disease     Family History: Family History  Problem Relation Age of Onset   Kidney cancer Neg Hx    Prostate cancer Neg Hx    Bladder Cancer Neg Hx     Social History   Socioeconomic History   Marital status: Single    Spouse name: Not on file   Number of children: Not on file   Years of education: Not on file   Highest education level: Not on file  Occupational History   Not on file  Tobacco Use   Smoking  status: Never   Smokeless tobacco: Never  Substance and Sexual Activity   Alcohol use: Not Currently    Comment: rarley   Drug use: No   Sexual activity: Not on file  Other Topics Concern   Not on file  Social History Narrative   Not on file   Social Determinants of Health   Financial Resource Strain: Not on file  Food Insecurity: Not on file  Transportation Needs: Not on file  Physical Activity: Not on file  Stress: Not on file  Social Connections: Not on file  Intimate Partner Violence: Not on file      Review of Systems  Constitutional:  Negative for chills, fatigue and unexpected weight change.  HENT:  Negative for congestion, rhinorrhea, sneezing and sore throat.   Eyes:  Negative for redness.  Respiratory:  Negative for cough, chest tightness and shortness of breath.   Cardiovascular:  Negative for chest pain and palpitations.  Gastrointestinal:  Negative for abdominal pain, constipation, diarrhea, nausea and vomiting.  Genitourinary:  Negative for dysuria and frequency.  Musculoskeletal:  Negative for arthralgias, back pain, joint swelling and neck pain.  Skin:  Negative for rash.  Neurological: Negative.  Negative for tremors and numbness.  Hematological:  Negative for adenopathy. Does not bruise/bleed easily.  Psychiatric/Behavioral:  Negative for behavioral problems (Depression), sleep disturbance and suicidal ideas. The patient is not nervous/anxious.  Vital Signs: BP 115/60   Pulse 67   Temp 98.7 F (37.1 C)   Resp 16   Ht 5\' 8"  (1.727 m)   Wt 172 lb 6.4 oz (78.2 kg)   SpO2 98%   BMI 26.21 kg/m    Physical Exam Vitals reviewed.  Constitutional:      General: He is not in acute distress.    Appearance: Normal appearance. He is normal weight. He is not ill-appearing.  HENT:     Head: Normocephalic and atraumatic.  Eyes:     Pupils: Pupils are equal, round, and reactive to light.  Cardiovascular:     Rate and Rhythm: Normal rate and regular  rhythm.  Pulmonary:     Effort: Pulmonary effort is normal. No respiratory distress.  Neurological:     Mental Status: He is alert and oriented to person, place, and time.     Cranial Nerves: No cranial nerve deficit.     Coordination: Coordination normal.     Gait: Gait normal.  Psychiatric:        Mood and Affect: Mood normal.        Behavior: Behavior normal.        Assessment/Plan: 1. Acquired hypothyroidism Stable, continue levothyroxine as prescribed. Will need to repeat thyroid levels at next office visit.   2. Other insomnia Continue clonazepam as prescribed, refill x3 months sent, follow up in 3 months for additional refills.  - clonazePAM (KLONOPIN) 0.5 MG tablet; Take 1 tablet (0.5 mg total) by mouth at bedtime as needed for anxiety.  Dispense: 90 tablet; Refill: 0  3. Generalized anxiety disorder Takes clonazepam at bedtime for insomnia related to anxiety. See problem #2   General Counseling: Add verbalizes understanding of the findings of todays visit and agrees with plan of treatment. I have discussed any further diagnostic evaluation that may be needed or ordered today. We also reviewed his medications today. he has been encouraged to call the office with any questions or concerns that should arise related to todays visit.    No orders of the defined types were placed in this encounter.   Meds ordered this encounter  Medications   clonazePAM (KLONOPIN) 0.5 MG tablet    Sig: Take 1 tablet (0.5 mg total) by mouth at bedtime as needed for anxiety.    Dispense:  90 tablet    Refill:  0    Return in 3 months (on 04/30/2022) for F/U, med refill, [facility] PCP.   Total time spent:30 Minutes Time spent includes review of chart, medications, test results, and follow up plan with the patient.   [place] Controlled Substance Database was reviewed by me.  This patient was seen by Jonetta Osgood, FNP-C in collaboration with Dr. Clayborn Bigness as a part of collaborative  care agreement.   Alayiah Fontes R. Valetta Fuller, MSN, FNP-C Internal medicine

## 2022-01-30 ENCOUNTER — Ambulatory Visit: Payer: Self-pay | Admitting: Nurse Practitioner

## 2022-03-14 ENCOUNTER — Encounter: Payer: Self-pay | Admitting: Nurse Practitioner

## 2022-04-26 ENCOUNTER — Ambulatory Visit (INDEPENDENT_AMBULATORY_CARE_PROVIDER_SITE_OTHER): Payer: Self-pay | Admitting: Nurse Practitioner

## 2022-04-26 ENCOUNTER — Encounter: Payer: Self-pay | Admitting: Nurse Practitioner

## 2022-04-26 VITALS — BP 119/71 | HR 72 | Temp 98.0°F | Resp 16 | Ht 68.0 in | Wt 166.6 lb

## 2022-04-26 DIAGNOSIS — G4709 Other insomnia: Secondary | ICD-10-CM

## 2022-04-26 DIAGNOSIS — K047 Periapical abscess without sinus: Secondary | ICD-10-CM

## 2022-04-26 DIAGNOSIS — E039 Hypothyroidism, unspecified: Secondary | ICD-10-CM

## 2022-04-26 MED ORDER — CLONAZEPAM 0.5 MG PO TABS: 0.5000 mg | ORAL_TABLET | Freq: Every evening | ORAL | 0 refills | Status: DC | PRN

## 2022-04-26 MED ORDER — AMOXICILLIN 500 MG PO CAPS: 500.0000 mg | ORAL_CAPSULE | Freq: Two times a day (BID) | ORAL | 0 refills | Status: AC

## 2022-04-26 MED ORDER — LEVOTHYROXINE SODIUM 50 MCG PO TABS: ORAL_TABLET | ORAL | 1 refills | Status: DC

## 2022-04-26 NOTE — Progress Notes (Unsigned)
[facility] [address] [place], [place] [address]  Internal MEDICINE  Office Visit Note  Patient Name: Lawrence Taylor  [id number]  [id number]  Date of Service: 04/26/2022  Chief Complaint  Patient presents with   Medication Refill    HPI Lawrence Taylor presents for a follow-up visit for      Current Medication: Outpatient Encounter Medications as of 04/26/2022  Medication Sig   amoxicillin (AMOXIL) 500 MG capsule Take 1 capsule (500 mg total) by mouth 2 (two) times daily for 10 days. Take with food.   ibuprofen (ADVIL) 800 MG tablet Take 1 tablet (800 mg total) by mouth every 8 (eight) hours as needed for moderate pain.   [DISCONTINUED] clonazePAM (KLONOPIN) 0.5 MG tablet Take 1 tablet (0.5 mg total) by mouth at bedtime as needed for anxiety.   [DISCONTINUED] levothyroxine (SYNTHROID) 50 MCG tablet TAKE 1 TABLET BY MOUTH EVERY DAY BEFORE BREAKFAST   clonazePAM (KLONOPIN) 0.5 MG tablet Take 1 tablet (0.5 mg total) by mouth at bedtime as needed for anxiety.   levothyroxine (SYNTHROID) 50 MCG tablet TAKE 1 TABLET BY MOUTH EVERY DAY BEFORE BREAKFAST   No facility-administered encounter medications on file as of 04/26/2022.    Surgical History: History reviewed. No pertinent surgical history.  Medical History: Past Medical History:  Diagnosis Date   Thyroid disease     Family History: Family History  Problem Relation Age of Onset   Kidney cancer Neg Hx    Prostate cancer Neg Hx    Bladder Cancer Neg Hx     Social History   Socioeconomic History   Marital status: Single    Spouse name: Not on file   Number of children: Not on file   Years of education: Not on file   Highest education level: Not on file  Occupational History   Not on file  Tobacco Use   Smoking status: Never   Smokeless tobacco: Never  Substance and Sexual Activity   Alcohol use: Not Currently    Comment: rarley   Drug use: No   Sexual activity: Not on file  Other Topics Concern    Not on file  Social History Narrative   Not on file   Social Determinants of Health   Financial Resource Strain: Not on file  Food Insecurity: Not on file  Transportation Needs: Not on file  Physical Activity: Not on file  Stress: Not on file  Social Connections: Not on file  Intimate Partner Violence: Not on file      Review of Systems  Vital Signs: BP 119/71   Pulse 72   Temp 98 F (36.7 C)   Resp 16   Ht 5\' 8"  (1.727 m)   Wt 166 lb 9.6 oz (75.6 kg)   SpO2 100%   BMI 25.33 kg/m    Physical Exam     Assessment/Plan:   General Counseling: Lawrence Taylor verbalizes understanding of the findings of todays visit and agrees with plan of treatment. I have discussed any further diagnostic evaluation that may be needed or ordered today. We also reviewed his medications today. he has been encouraged to call the office with any questions or concerns that should arise related to todays visit.    No orders of the defined types were placed in this encounter.   Meds ordered this encounter  Medications   clonazePAM (KLONOPIN) 0.5 MG tablet    Sig: Take 1 tablet (0.5 mg total) by mouth at bedtime as needed for anxiety.  Dispense:  90 tablet    Refill:  0   amoxicillin (AMOXIL) 500 MG capsule    Sig: Take 1 capsule (500 mg total) by mouth 2 (two) times daily for 10 days. Take with food.    Dispense:  20 capsule    Refill:  0   levothyroxine (SYNTHROID) 50 MCG tablet    Sig: TAKE 1 TABLET BY MOUTH EVERY DAY BEFORE BREAKFAST    Dispense:  90 tablet    Refill:  1    For future refills    Return in about 3 months (around 07/26/2022) for follow up for refills around 11/20, 11/21 or 11/22.   Total time spent:*** Minutes Time spent includes review of chart, medications, test results, and follow up plan with the patient.   [place] Controlled Substance Database was reviewed by me.  This patient was seen by Sallyanne Kuster, FNP-C in collaboration with Dr. Beverely Risen as a part  of collaborative care agreement.   Ernestine Langworthy R. Tedd Sias, MSN, FNP-C Internal medicine

## 2022-04-28 ENCOUNTER — Encounter: Payer: Self-pay | Admitting: Nurse Practitioner

## 2022-04-30 ENCOUNTER — Ambulatory Visit: Payer: Self-pay | Admitting: Nurse Practitioner

## 2022-07-10 ENCOUNTER — Telehealth: Payer: Self-pay | Admitting: Nurse Practitioner

## 2022-07-10 NOTE — Telephone Encounter (Signed)
Lvm to move 07/17/22 appointment to 11:40-Lawrence Taylor

## 2022-07-17 ENCOUNTER — Encounter: Payer: Self-pay | Admitting: Nurse Practitioner

## 2022-07-17 ENCOUNTER — Ambulatory Visit: Payer: Self-pay | Admitting: Nurse Practitioner

## 2022-07-17 VITALS — BP 112/68 | HR 84 | Temp 98.4°F | Resp 16 | Ht 68.0 in | Wt 162.6 lb

## 2022-07-17 DIAGNOSIS — E039 Hypothyroidism, unspecified: Secondary | ICD-10-CM

## 2022-07-17 DIAGNOSIS — E782 Mixed hyperlipidemia: Secondary | ICD-10-CM

## 2022-07-17 DIAGNOSIS — G4709 Other insomnia: Secondary | ICD-10-CM

## 2022-07-17 MED ORDER — LEVOTHYROXINE SODIUM 50 MCG PO TABS: ORAL_TABLET | ORAL | 1 refills | Status: DC

## 2022-07-17 MED ORDER — CLONAZEPAM 0.5 MG PO TABS: 0.5000 mg | ORAL_TABLET | Freq: Every evening | ORAL | 0 refills | Status: DC | PRN

## 2022-07-17 NOTE — Progress Notes (Signed)
[facility] [place], [place] [address]  Internal MEDICINE  Office Visit Note  Patient Name: Lawrence Taylor  [id number]  [id number]  Date of Service: 07/17/2022  Chief Complaint  Patient presents with   Follow-up    Meds refills     HPI Lawrence Taylor presents for a follow up visit for insomnia, hypothyroidism, and medication refills.  Hypothyroidism -- takes levothyroxine -- has not had labs in over a year Insomnia - takes clonazepam Medication refills -- due now, comes every 3 months  Labs -- over due, last routine labs were over a year ago.    Current Medication: Outpatient Encounter Medications as of 07/17/2022  Medication Sig   [DISCONTINUED] clonazePAM (KLONOPIN) 0.5 MG tablet Take 1 tablet (0.5 mg total) by mouth at bedtime as needed for anxiety.   [DISCONTINUED] ibuprofen (ADVIL) 800 MG tablet Take 1 tablet (800 mg total) by mouth every 8 (eight) hours as needed for moderate pain.   [DISCONTINUED] levothyroxine (SYNTHROID) 50 MCG tablet TAKE 1 TABLET BY MOUTH EVERY DAY BEFORE BREAKFAST   clonazePAM (KLONOPIN) 0.5 MG tablet Take 1 tablet (0.5 mg total) by mouth at bedtime as needed for anxiety.   levothyroxine (SYNTHROID) 50 MCG tablet TAKE 1 TABLET BY MOUTH EVERY DAY BEFORE BREAKFAST   No facility-administered encounter medications on file as of 07/17/2022.    Surgical History: History reviewed. No pertinent surgical history.  Medical History: Past Medical History:  Diagnosis Date   Thyroid disease     Family History: Family History  Problem Relation Age of Onset   Kidney cancer Neg Hx    Prostate cancer Neg Hx    Bladder Cancer Neg Hx     Social History   Socioeconomic History   Marital status: Single    Spouse name: Not on file   Number of children: Not on file   Years of education: Not on file   Highest education level: Not on file  Occupational History   Not on file  Tobacco Use   Smoking status: Never   Smokeless  tobacco: Never  Substance and Sexual Activity   Alcohol use: Not Currently    Comment: rarley   Drug use: No   Sexual activity: Not on file  Other Topics Concern   Not on file  Social History Narrative   Not on file   Social Determinants of Health   Financial Resource Strain: Not on file  Food Insecurity: Not on file  Transportation Needs: Not on file  Physical Activity: Not on file  Stress: Not on file  Social Connections: Not on file  Intimate Partner Violence: Not on file      Review of Systems  Constitutional:  Negative for chills, fatigue and unexpected weight change.  HENT:  Negative for congestion, rhinorrhea, sneezing and sore throat.   Eyes:  Negative for redness.  Respiratory:  Negative for cough, chest tightness and shortness of breath.   Cardiovascular:  Negative for chest pain and palpitations.  Gastrointestinal:  Negative for abdominal pain, constipation, diarrhea, nausea and vomiting.  Genitourinary:  Negative for dysuria and frequency.  Musculoskeletal:  Negative for arthralgias, back pain, joint swelling and neck pain.  Skin:  Negative for rash.  Neurological: Negative.  Negative for tremors and numbness.  Hematological:  Negative for adenopathy. Does not bruise/bleed easily.  Psychiatric/Behavioral:  Negative for behavioral problems (Depression), sleep disturbance and suicidal ideas. The patient is not nervous/anxious.     Vital Signs: BP 112/68   Pulse  84   Temp 98.4 F (36.9 C)   Resp 16   Ht _0  (1.727 m)   Wt 162 lb 9.6 oz (73.8 kg)   SpO2 99%   BMI 24.72 kg/m    Physical Exam Vitals reviewed.  Constitutional:      General: He is not in acute distress.    Appearance: Normal appearance. He is normal weight. He is not ill-appearing.  HENT:     Head: Normocephalic and atraumatic.  Eyes:     Pupils: Pupils are equal, round, and reactive to light.  Cardiovascular:     Rate and Rhythm: Normal rate and regular rhythm.  Pulmonary:      Effort: Pulmonary effort is normal. No respiratory distress.  Neurological:     Mental Status: He is alert and oriented to person, place, and time.     Cranial Nerves: No cranial nerve deficit.     Coordination: Coordination normal.     Gait: Gait normal.  Psychiatric:        Mood and Affect: Mood normal.        Behavior: Behavior normal.        Assessment/Plan: 1. Acquired hypothyroidism Refills ordered, continue levothyroxine as prescribed, routine labs also ordered, will call patient with results - levothyroxine (SYNTHROID) 50 MCG tablet; TAKE 1 TABLET BY MOUTH EVERY DAY BEFORE BREAKFAST  Dispense: 90 tablet; Refill: 1 - CBC with Differential/Platelet - CMP14+EGFR - Lipid Profile - TSH + free T4  2. Mixed hyperlipidemia Routine lab ordered - Lipid Profile  3. Other insomnia Continue clonazepam as prescribed. Routine labs ordered as well - clonazePAM (KLONOPIN) 0.5 MG tablet; Take 1 tablet (0.5 mg total) by mouth at bedtime as needed for anxiety.  Dispense: 90 tablet; Refill: 0 - CBC with Differential/Platelet - CMP14+EGFR - Lipid Profile - TSH + free T4   General Counseling: Lawrence Taylor verbalizes understanding of the findings of todays visit and agrees with plan of treatment. I have discussed any further diagnostic evaluation that may be needed or ordered today. We also reviewed his medications today. he has been encouraged to call the office with any questions or concerns that should arise related to todays visit.    Orders Placed This Encounter  Procedures   CBC with Differential/Platelet   CMP14+EGFR   Lipid Profile   TSH + free T4    Meds ordered this encounter  Medications   levothyroxine (SYNTHROID) 50 MCG tablet    Sig: TAKE 1 TABLET BY MOUTH EVERY DAY BEFORE BREAKFAST    Dispense:  90 tablet    Refill:  1    For future refills   clonazePAM (KLONOPIN) 0.5 MG tablet    Sig: Take 1 tablet (0.5 mg total) by mouth at bedtime as needed for anxiety.     Dispense:  90 tablet    Refill:  0    Return in about 3 months (around 10/17/2022) for F/U, med refill, Kristelle Cavallaro PCP will call with lab results.   Total time spent:30 Minutes Time spent includes review of chart, medications, test results, and follow up plan with the patient.   [place] Controlled Substance Database was reviewed by me.  This patient was seen by Jonetta Osgood, FNP-C in collaboration with Dr. Clayborn Bigness as a part of collaborative care agreement.   Latonga Ponder R. Valetta Fuller, MSN, FNP-C Internal medicine

## 2022-07-19 ENCOUNTER — Encounter: Payer: Self-pay | Admitting: Nurse Practitioner

## 2022-10-15 ENCOUNTER — Ambulatory Visit: Payer: Self-pay | Admitting: Nurse Practitioner

## 2022-10-15 ENCOUNTER — Encounter: Payer: Self-pay | Admitting: Nurse Practitioner

## 2022-10-15 VITALS — BP 126/80 | HR 71 | Temp 98.0°F | Resp 16 | Ht 68.0 in | Wt 162.8 lb

## 2022-10-15 DIAGNOSIS — G4709 Other insomnia: Secondary | ICD-10-CM

## 2022-10-15 DIAGNOSIS — F411 Generalized anxiety disorder: Secondary | ICD-10-CM

## 2022-10-15 DIAGNOSIS — E039 Hypothyroidism, unspecified: Secondary | ICD-10-CM

## 2022-10-15 MED ORDER — LEVOTHYROXINE SODIUM 50 MCG PO TABS: ORAL_TABLET | ORAL | 1 refills | Status: DC

## 2022-10-15 MED ORDER — CLONAZEPAM 0.5 MG PO TABS: 0.5000 mg | ORAL_TABLET | Freq: Every evening | ORAL | 0 refills | Status: DC | PRN

## 2022-10-15 NOTE — Progress Notes (Unsigned)
[facility] [place], [place] [address]  Internal MEDICINE  Office Visit Note  Patient Name: Lawrence Taylor  [id number]  [id number]  Date of Service: 10/15/2022  Chief Complaint  Patient presents with   Follow-up    HPI Alen presents for a follow-up visit for Hypothyroidism -- takes levothyroxine -- has not had labs in over a year Insomnia - takes clonazepam Medication refills -- due now, comes every 3 months  Labs -- over due, last routine labs were over a year ago    Current Medication: Outpatient Encounter Medications as of 10/15/2022  Medication Sig   [DISCONTINUED] clonazePAM (KLONOPIN) 0.5 MG tablet Take 1 tablet (0.5 mg total) by mouth at bedtime as needed for anxiety.   [DISCONTINUED] levothyroxine (SYNTHROID) 50 MCG tablet TAKE 1 TABLET BY MOUTH EVERY DAY BEFORE BREAKFAST   clonazePAM (KLONOPIN) 0.5 MG tablet Take 1 tablet (0.5 mg total) by mouth at bedtime as needed for anxiety.   levothyroxine (SYNTHROID) 50 MCG tablet TAKE 1 TABLET BY MOUTH EVERY DAY BEFORE BREAKFAST   No facility-administered encounter medications on file as of 10/15/2022.    Surgical History: History reviewed. No pertinent surgical history.  Medical History: Past Medical History:  Diagnosis Date   Thyroid disease     Family History: Family History  Problem Relation Age of Onset   Kidney cancer Neg Hx    Prostate cancer Neg Hx    Bladder Cancer Neg Hx     Social History   Socioeconomic History   Marital status: Single    Spouse name: Not on file   Number of children: Not on file   Years of education: Not on file   Highest education level: Not on file  Occupational History   Not on file  Tobacco Use   Smoking status: Never   Smokeless tobacco: Never  Substance and Sexual Activity   Alcohol use: Not Currently    Comment: rarley   Drug use: No   Sexual activity: Not on file  Other Topics Concern   Not on file  Social History Narrative    Not on file   Social Determinants of Health   Financial Resource Strain: Not on file  Food Insecurity: Not on file  Transportation Needs: Not on file  Physical Activity: Not on file  Stress: Not on file  Social Connections: Not on file  Intimate Partner Violence: Not on file      Review of Systems  Vital Signs: BP 126/80   Pulse 71   Temp 98 F (36.7 C)   Resp 16   Ht 5' 8"$  (1.727 m)   Wt 162 lb 12.8 oz (73.8 kg)   SpO2 97%   BMI 24.75 kg/m    Physical Exam     Assessment/Plan:   General Counseling: Erasmo verbalizes understanding of the findings of todays visit and agrees with plan of treatment. I have discussed any further diagnostic evaluation that may be needed or ordered today. We also reviewed his medications today. he has been encouraged to call the office with any questions or concerns that should arise related to todays visit.    Orders Placed This Encounter  Procedures   TSH + free T4    Meds ordered this encounter  Medications   clonazePAM (KLONOPIN) 0.5 MG tablet    Sig: Take 1 tablet (0.5 mg total) by mouth at bedtime as needed for anxiety.    Dispense:  90 tablet    Refill:  0  levothyroxine (SYNTHROID) 50 MCG tablet    Sig: TAKE 1 TABLET BY MOUTH EVERY DAY BEFORE BREAKFAST    Dispense:  90 tablet    Refill:  1    For future refills    Return in about 3 months (around 01/06/2023) for CPE, Shiryl Ruddy PCP with med refill. please cancel 11/07/22 visit and move annual physical w/med refill. .   Total time spent:*** Minutes Time spent includes review of chart, medications, test results, and follow up plan with the patient.   [place] Controlled Substance Database was reviewed by me.  This patient was seen by Jonetta Osgood, FNP-C in collaboration with Dr. Clayborn Bigness as a part of collaborative care agreement.   Deborrah Mabin R. Valetta Fuller, MSN, FNP-C Internal medicine

## 2022-10-16 ENCOUNTER — Encounter: Payer: Self-pay | Admitting: Nurse Practitioner

## 2022-11-07 ENCOUNTER — Encounter: Payer: Self-pay | Admitting: Nurse Practitioner

## 2022-11-29 LAB — TSH+FREE T4
Free T4: 1.46 ng/dL (ref 0.82–1.77)
TSH: 2.37 u[IU]/mL (ref 0.450–4.500)

## 2023-01-13 ENCOUNTER — Ambulatory Visit: Payer: BLUE CROSS/BLUE SHIELD | Admitting: Nurse Practitioner

## 2023-01-13 ENCOUNTER — Encounter: Payer: Self-pay | Admitting: Nurse Practitioner

## 2023-01-13 VITALS — BP 120/80 | HR 66 | Temp 98.4°F | Resp 16 | Ht 68.0 in | Wt 160.0 lb

## 2023-01-13 DIAGNOSIS — Z79899 Other long term (current) drug therapy: Secondary | ICD-10-CM

## 2023-01-13 DIAGNOSIS — K047 Periapical abscess without sinus: Secondary | ICD-10-CM

## 2023-01-13 DIAGNOSIS — E039 Hypothyroidism, unspecified: Secondary | ICD-10-CM

## 2023-01-13 DIAGNOSIS — G4709 Other insomnia: Secondary | ICD-10-CM

## 2023-01-13 DIAGNOSIS — Z0001 Encounter for general adult medical examination with abnormal findings: Secondary | ICD-10-CM

## 2023-01-13 DIAGNOSIS — R300 Dysuria: Secondary | ICD-10-CM

## 2023-01-13 LAB — POCT URINE DRUG SCREEN
Methylenedioxyamphetamine: NOT DETECTED
POC Amphetamine UR: NOT DETECTED
POC BENZODIAZEPINES UR: NOT DETECTED
POC Barbiturate UR: NOT DETECTED
POC Cocaine UR: NOT DETECTED
POC Ecstasy UR: NOT DETECTED
POC Marijuana UR: NOT DETECTED
POC Methadone UR: NOT DETECTED
POC Methamphetamine UR: NOT DETECTED
POC Opiate Ur: NOT DETECTED
POC Oxycodone UR: NOT DETECTED
POC PHENCYCLIDINE UR: NOT DETECTED
POC TRICYCLICS UR: NOT DETECTED

## 2023-01-13 MED ORDER — CLONAZEPAM 0.5 MG PO TABS: 0.5000 mg | ORAL_TABLET | Freq: Every evening | ORAL | 0 refills | Status: AC | PRN

## 2023-01-13 MED ORDER — LEVOTHYROXINE SODIUM 50 MCG PO TABS: ORAL_TABLET | ORAL | 1 refills | Status: DC

## 2023-01-13 MED ORDER — PENICILLIN V POTASSIUM 500 MG PO TABS: 500.0000 mg | ORAL_TABLET | Freq: Three times a day (TID) | ORAL | 0 refills | Status: AC

## 2023-01-13 NOTE — Progress Notes (Signed)
[facility] [address] [place], [place] [address]  Internal MEDICINE  Office Visit Note  Patient Name: Lawrence Taylor  [id number]  [id number]  Date of Service: 01/13/2023  Chief Complaint  Patient presents with   Annual Exam   Hypothyroidism    HPI Lawrence Taylor presents for an annual well visit and physical exam.  Well-appearing 37 y.o. male with hypothyroidism and insomnia.  Labs: thyroid lab up to date, will defer other routine labs for later this year.  New or worsening pain: none  Other concerns: tooth is bothering him again.wants antibiotic same one as before. Has a tooth on the lower right side with a hole in it that gets recurrent infections.    Current Medication: Outpatient Encounter Medications as of 01/13/2023  Medication Sig   [DISCONTINUED] clonazePAM (KLONOPIN) 0.5 MG tablet Take 1 tablet (0.5 mg total) by mouth at bedtime as needed for anxiety.   [DISCONTINUED] levothyroxine (SYNTHROID) 50 MCG tablet TAKE 1 TABLET BY MOUTH EVERY DAY BEFORE BREAKFAST   [DISCONTINUED] penicillin v potassium (VEETID) 500 MG tablet Take by mouth.   clonazePAM (KLONOPIN) 0.5 MG tablet Take 1 tablet (0.5 mg total) by mouth at bedtime as needed for anxiety.   levothyroxine (SYNTHROID) 50 MCG tablet TAKE 1 TABLET BY MOUTH EVERY DAY BEFORE BREAKFAST   penicillin v potassium (VEETID) 500 MG tablet Take 1 tablet (500 mg total) by mouth 3 (three) times daily for 7 days.   No facility-administered encounter medications on file as of 01/13/2023.    Surgical History: History reviewed. No pertinent surgical history.  Medical History: Past Medical History:  Diagnosis Date   Thyroid disease     Family History: Family History  Problem Relation Age of Onset   Kidney cancer Neg Hx    Prostate cancer Neg Hx    Bladder Cancer Neg Hx     Social History   Socioeconomic History   Marital status: Single    Spouse name: Not on file   Number of children: Not on file   Years  of education: Not on file   Highest education level: Not on file  Occupational History   Not on file  Tobacco Use   Smoking status: Never   Smokeless tobacco: Never  Substance and Sexual Activity   Alcohol use: Not Currently    Comment: rarley   Drug use: No   Sexual activity: Not on file  Other Topics Concern   Not on file  Social History Narrative   Not on file   Social Determinants of Health   Financial Resource Strain: Not on file  Food Insecurity: Not on file  Transportation Needs: Not on file  Physical Activity: Not on file  Stress: Not on file  Social Connections: Not on file  Intimate Partner Violence: Not on file      Review of Systems  Constitutional:  Negative for activity change, appetite change, chills, fatigue, fever and unexpected weight change.  HENT: Negative.  Negative for congestion, ear pain, rhinorrhea, sore throat and trouble swallowing.   Eyes: Negative.   Respiratory: Negative.  Negative for cough, chest tightness, shortness of breath and wheezing.   Cardiovascular: Negative.  Negative for chest pain.  Gastrointestinal: Negative.  Negative for abdominal pain, blood in stool, constipation, diarrhea, nausea and vomiting.  Endocrine: Negative.   Genitourinary: Negative.  Negative for difficulty urinating, dysuria, frequency, hematuria and urgency.  Musculoskeletal: Negative.  Negative for arthralgias, back pain, joint swelling, myalgias and neck pain.  Skin: Negative.  Negative for rash and wound.  Allergic/Immunologic: Negative.  Negative for immunocompromised state.  Neurological: Negative.  Negative for dizziness, seizures, numbness and headaches.  Hematological: Negative.   Psychiatric/Behavioral: Negative.  Negative for behavioral problems, self-injury and suicidal ideas. The patient is not nervous/anxious.     Vital Signs: BP 120/80   Pulse 66   Temp 98.4 F (36.9 C)   Resp 16   Ht 5\' 8"  (1.727 m)   Wt 160 lb (72.6 kg)   SpO2 99%    BMI 24.33 kg/m    Physical Exam Vitals reviewed.  Constitutional:      General: He is not in acute distress.    Appearance: Normal appearance. He is well-developed and normal weight. He is not ill-appearing or diaphoretic.  HENT:     Head: Normocephalic and atraumatic.     Right Ear: Tympanic membrane, ear canal and external ear normal.     Left Ear: Tympanic membrane, ear canal and external ear normal.     Nose: Nose normal. No congestion or rhinorrhea.     Mouth/Throat:     Mouth: Mucous membranes are moist.     Dentition: Abnormal dentition. Dental caries present.     Pharynx: No oropharyngeal exudate.      Comments: Marked site is where the hole is in his tooth Eyes:     General: No scleral icterus.       Right eye: No discharge.        Left eye: No discharge.     Conjunctiva/sclera: Conjunctivae normal.     Pupils: Pupils are equal, round, and reactive to light.  Neck:     Thyroid: No thyromegaly.     Vascular: No JVD.     Trachea: No tracheal deviation.  Cardiovascular:     Rate and Rhythm: Normal rate and regular rhythm.     Heart sounds: Normal heart sounds. No murmur heard.    No friction rub. No gallop.  Pulmonary:     Effort: Pulmonary effort is normal. No respiratory distress.     Breath sounds: Normal breath sounds. No stridor. No wheezing or rales.  Chest:     Chest wall: No tenderness.  Abdominal:     General: Bowel sounds are normal. There is no distension.     Palpations: Abdomen is soft. There is no mass.     Tenderness: There is no abdominal tenderness. There is no guarding or rebound.  Musculoskeletal:        General: No tenderness or deformity. Normal range of motion.     Cervical back: Normal range of motion and neck supple.  Lymphadenopathy:     Cervical: No cervical adenopathy.  Skin:    General: Skin is warm and dry.     Capillary Refill: Capillary refill takes less than 2 seconds.     Coloration: Skin is not pale.     Findings: No erythema  or rash.  Neurological:     Mental Status: He is alert and oriented to person, place, and time.     Cranial Nerves: No cranial nerve deficit.     Motor: No abnormal muscle tone.     Coordination: Coordination normal.     Deep Tendon Reflexes: Reflexes are normal and symmetric.  Psychiatric:        Behavior: Behavior normal.        Thought Content: Thought content normal.        Judgment: Judgment normal.        Assessment/Plan:  1. Encounter for routine adult health examination with abnormal findings Age-appropriate preventive screenings and vaccinations discussed, annual physical exam completed. Routine labs for health maintenance deferred except for thyroid labs which were done and were normal. PHM updated.   2. Acquired hypothyroidism Continue levothyroxine as prescribed.  - levothyroxine (SYNTHROID) 50 MCG tablet; TAKE 1 TABLET BY MOUTH EVERY DAY BEFORE BREAKFAST  Dispense: 90 tablet; Refill: 1  3. Tooth abscess Penicillin prescription sent to pharmacy, take as prescribed.  - penicillin v potassium (VEETID) 500 MG tablet; Take 1 tablet (500 mg total) by mouth 3 (three) times daily for 7 days.  Dispense: 21 tablet; Refill: 0  4. Other insomnia Continue clonazepam prn as prescribed  - clonazePAM (KLONOPIN) 0.5 MG tablet; Take 1 tablet (0.5 mg total) by mouth at bedtime as needed for anxiety.  Dispense: 90 tablet; Refill: 0  5. Encounter for long-term (current) use of medications UDS negative. Takes clonazepam as needed.  - POCT Urine Drug Screen     General Counseling: Leor verbalizes understanding of the findings of todays visit and agrees with plan of treatment. I have discussed any further diagnostic evaluation that may be needed or ordered today. We also reviewed his medications today. he has been encouraged to call the office with any questions or concerns that should arise related to todays visit.    Orders Placed This Encounter  Procedures   UA/M w/rflx  Culture, Routine   POCT Urine Drug Screen    Meds ordered this encounter  Medications   penicillin v potassium (VEETID) 500 MG tablet    Sig: Take 1 tablet (500 mg total) by mouth 3 (three) times daily for 7 days.    Dispense:  21 tablet    Refill:  0   clonazePAM (KLONOPIN) 0.5 MG tablet    Sig: Take 1 tablet (0.5 mg total) by mouth at bedtime as needed for anxiety.    Dispense:  90 tablet    Refill:  0   levothyroxine (SYNTHROID) 50 MCG tablet    Sig: TAKE 1 TABLET BY MOUTH EVERY DAY BEFORE BREAKFAST    Dispense:  90 tablet    Refill:  1    For future refills    Return in about 3 months (around 04/08/2023) for F/U, med refill, Keara Pagliarulo PCP clonazepam .   Total time spent:30 Minutes Time spent includes review of chart, medications, test results, and follow up plan with the patient.   [place] Controlled Substance Database was reviewed by me.  This patient was seen by Sallyanne Kuster, FNP-C in collaboration with Dr. Beverely Risen as a part of collaborative care agreement.  Maryellen Dowdle R. Tedd Sias, MSN, FNP-C Internal medicine

## 2023-01-14 LAB — UA/M W/RFLX CULTURE, ROUTINE
Bilirubin, UA: NEGATIVE
Glucose, UA: NEGATIVE
Leukocytes,UA: NEGATIVE
Nitrite, UA: NEGATIVE
Protein,UA: NEGATIVE
RBC, UA: NEGATIVE
Specific Gravity, UA: 1.026 (ref 1.005–1.030)
Urobilinogen, Ur: 0.2 mg/dL (ref 0.2–1.0)
pH, UA: 5.5 (ref 5.0–7.5)

## 2023-01-14 LAB — MICROSCOPIC EXAMINATION
Bacteria, UA: NONE SEEN
Casts: NONE SEEN /lpf
Epithelial Cells (non renal): NONE SEEN /hpf (ref 0–10)
WBC, UA: NONE SEEN /hpf (ref 0–5)

## 2023-04-10 ENCOUNTER — Ambulatory Visit (INDEPENDENT_AMBULATORY_CARE_PROVIDER_SITE_OTHER): Payer: Self-pay | Admitting: Nurse Practitioner

## 2023-04-10 ENCOUNTER — Encounter: Payer: Self-pay | Admitting: Nurse Practitioner

## 2023-04-10 VITALS — BP 130/76 | HR 70 | Temp 98.4°F | Resp 16 | Ht 68.0 in | Wt 156.4 lb

## 2023-04-10 DIAGNOSIS — Z5181 Encounter for therapeutic drug level monitoring: Secondary | ICD-10-CM

## 2023-04-10 DIAGNOSIS — E039 Hypothyroidism, unspecified: Secondary | ICD-10-CM

## 2023-04-10 DIAGNOSIS — Z79899 Other long term (current) drug therapy: Secondary | ICD-10-CM

## 2023-04-10 LAB — POCT URINE DRUG SCREEN
Methylenedioxyamphetamine: NOT DETECTED
POC Amphetamine UR: NOT DETECTED
POC BENZODIAZEPINES UR: NOT DETECTED
POC Barbiturate UR: NOT DETECTED
POC Cocaine UR: NOT DETECTED
POC Ecstasy UR: NOT DETECTED
POC Marijuana UR: NOT DETECTED
POC Methadone UR: NOT DETECTED
POC Methamphetamine UR: NOT DETECTED
POC Opiate Ur: NOT DETECTED
POC Oxycodone UR: NOT DETECTED
POC PHENCYCLIDINE UR: NOT DETECTED
POC TRICYCLICS UR: NOT DETECTED

## 2023-04-10 MED ORDER — LEVOTHYROXINE SODIUM 50 MCG PO TABS: ORAL_TABLET | ORAL | 1 refills | Status: AC

## 2023-04-10 NOTE — Progress Notes (Signed)
[facility] [address] [place], [place] [address]  Internal MEDICINE  Office Visit Note  Patient Name: Lawrence Taylor  [id number]  [id number]  Date of Service: 04/10/2023  Chief Complaint  Patient presents with   Follow-up    HPI Jahcere presents for a follow-up visit for hypothyroidism and insomnia Hypothyroidism -- controlled with current dose, no change. Insomnia -- has been prescribed clonazepam for a long time. He does a urine drug screen periodically to check that the medication is in his system so we have documentation for future refills. When reviewing his medical record, it is noted that all prior urine drug screens are negative for all drug classes including benzodiazepines. This was discussed with the patient. He reports that he takes 1 clonazepam tablet every night at bedtime to help him sleep. He states that he is very physically active and drinks a lot of water and that maybe this is why the medication is not in his system.  Adherence to medication regimen of prescribed controlled substance -- all prior urine drug screens and his urine drug screen today have been negative for benzodiazepines and all other drug classes. PDMP was reviewed and he has been consistent in getting his medication refilled on time without any discrepancies. He is also punctual and comes to his office visits as scheduled without issue.     Current Medication: Outpatient Encounter Medications as of 04/10/2023  Medication Sig   clonazePAM (KLONOPIN) 0.5 MG tablet Take 1 tablet (0.5 mg total) by mouth at bedtime as needed for anxiety.   [DISCONTINUED] levothyroxine (SYNTHROID) 50 MCG tablet TAKE 1 TABLET BY MOUTH EVERY DAY BEFORE BREAKFAST   levothyroxine (SYNTHROID) 50 MCG tablet TAKE 1 TABLET BY MOUTH EVERY DAY BEFORE BREAKFAST   No facility-administered encounter medications on file as of 04/10/2023.    Surgical History: History reviewed. No pertinent surgical  history.  Medical History: Past Medical History:  Diagnosis Date   Thyroid disease     Family History: Family History  Problem Relation Age of Onset   Kidney cancer Neg Hx    Prostate cancer Neg Hx    Bladder Cancer Neg Hx     Social History   Socioeconomic History   Marital status: Single    Spouse name: Not on file   Number of children: Not on file   Years of education: Not on file   Highest education level: Not on file  Occupational History   Not on file  Tobacco Use   Smoking status: Never   Smokeless tobacco: Never  Substance and Sexual Activity   Alcohol use: Not Currently    Comment: rarley   Drug use: No   Sexual activity: Not on file  Other Topics Concern   Not on file  Social History Narrative   Not on file   Social Determinants of Health   Financial Resource Strain: Not on file  Food Insecurity: Not on file  Transportation Needs: Not on file  Physical Activity: Not on file  Stress: Not on file  Social Connections: Not on file  Intimate Partner Violence: Not on file      Review of Systems  Constitutional:  Negative for chills, fatigue and unexpected weight change.  HENT:  Negative for congestion, rhinorrhea, sneezing and sore throat.   Eyes:  Negative for redness.  Respiratory:  Negative for cough, chest tightness and shortness of breath.   Cardiovascular:  Negative for chest pain and palpitations.  Gastrointestinal:  Negative for abdominal pain,  constipation, diarrhea, nausea and vomiting.  Genitourinary:  Negative for dysuria and frequency.  Musculoskeletal:  Negative for arthralgias, back pain, joint swelling and neck pain.  Skin:  Negative for rash.  Neurological: Negative.  Negative for tremors and numbness.  Hematological:  Negative for adenopathy. Does not bruise/bleed easily.  Psychiatric/Behavioral:  Negative for behavioral problems (Depression), sleep disturbance and suicidal ideas. The patient is not nervous/anxious.     Vital  Signs: BP 130/76   Pulse 70   Temp 98.4 F (36.9 C)   Resp 16   Ht 5\' 8"  (1.727 m)   Wt 156 lb 6.4 oz (70.9 kg)   SpO2 99%   BMI 23.78 kg/m    Physical Exam Vitals reviewed.  Constitutional:      General: He is not in acute distress.    Appearance: Normal appearance. He is normal weight. He is not ill-appearing.  HENT:     Head: Normocephalic and atraumatic.  Eyes:     Pupils: Pupils are equal, round, and reactive to light.  Cardiovascular:     Rate and Rhythm: Normal rate and regular rhythm.  Pulmonary:     Effort: Pulmonary effort is normal. No respiratory distress.  Neurological:     Mental Status: He is alert and oriented to person, place, and time.     Cranial Nerves: No cranial nerve deficit.     Coordination: Coordination normal.     Gait: Gait normal.  Psychiatric:        Mood and Affect: Mood normal.        Behavior: Behavior normal.        Assessment/Plan: 1. Acquired hypothyroidism Continue levothyroxine as prescribed.  - levothyroxine (SYNTHROID) 50 MCG tablet; TAKE 1 TABLET BY MOUTH EVERY DAY BEFORE BREAKFAST  Dispense: 90 tablet; Refill: 1  2. Encounter for long-term (current) use of medications Urine drug screen was negative for benzodiazepines and all other drug classes. This is not consistent with current medication prescriptions. Patient insists that he is compliant with his clonazepam and takes 1 tablet daily at bedtime to help him sleep. Clonazepam can be detected in the urine for at least 1-2 weeks after being ingested. Serum clonazepam level ordered. No clonazepam refill ordered today.  - POCT Urine Drug Screen - Clonazepam level  3. Encounter for therapeutic drug level monitoring UDS was negative, which is not consistent with current prescriptions. Serum clonazepam level ordered.  - Clonazepam level   General Counseling: Blythe verbalizes understanding of the findings of todays visit and agrees with plan of treatment. I have discussed any  further diagnostic evaluation that may be needed or ordered today. We also reviewed his medications today. he has been encouraged to call the office with any questions or concerns that should arise related to todays visit.    Orders Placed This Encounter  Procedures   Clonazepam level   POCT Urine Drug Screen    Meds ordered this encounter  Medications   levothyroxine (SYNTHROID) 50 MCG tablet    Sig: TAKE 1 TABLET BY MOUTH EVERY DAY BEFORE BREAKFAST    Dispense:  90 tablet    Refill:  1    For future refills    Return for will wait for the lab, call the patient with the results and determine next steps. .   Total time spent:30 Minutes Time spent includes review of chart, medications, test results, and follow up plan with the patient.   [place] Controlled Substance Database was reviewed by me.  This patient  was seen by Sallyanne Kuster, FNP-C in collaboration with Dr. Beverely Risen as a part of collaborative care agreement.    R. Tedd Sias, MSN, FNP-C Internal medicine

## 2023-04-12 LAB — CLONAZEPAM LEVEL: Clonazepam Lvl: <5 ng/mL — ABNORMAL LOW (ref 20–70)

## 2023-04-16 ENCOUNTER — Telehealth: Payer: Self-pay

## 2023-04-16 ENCOUNTER — Other Ambulatory Visit: Payer: Self-pay | Admitting: Nurse Practitioner

## 2023-04-16 DIAGNOSIS — G4709 Other insomnia: Secondary | ICD-10-CM

## 2023-04-16 NOTE — Progress Notes (Signed)
Patient has always tested negative on urine drug screen for clonazepam despite insisting that he takes one tablet every night at bedtime to help him sleep.  A serum clonazepam level was drawn the same day of his most recent office visit and this resulted in a level of <5. He continues to state that he takes the medication every night.  He is on time for appointments and picks up his medication on time per PDMP records but we have not had any documentation showing evidence of the medication in his urine or blood.   He called requesting the medication. He was informed that his PCP can no longer prescribe the medication. He was offered a referral to psychiatry and agreed with this suggestion.

## 2023-04-16 NOTE — Telephone Encounter (Signed)
Patient called asking if we were going to give him his clonazePAM, Alyssa stated No since it was not in his urine or blood work she can't give it to him but can give him a referral and he is wanting the referral cause he can't sleep, body hurts and is tired so he can't work.

## 2023-04-18 ENCOUNTER — Telehealth: Payer: Self-pay | Admitting: Nurse Practitioner

## 2023-04-18 NOTE — Telephone Encounter (Signed)
[facility] referral faxed to Dr. Maryruth Bun; [phone]. Notified patient. Gave pt telephone # [phone]

## 2024-01-14 ENCOUNTER — Encounter: Payer: Self-pay | Admitting: Nurse Practitioner

## 2024-07-16 ENCOUNTER — Other Ambulatory Visit: Payer: Self-pay | Admitting: Nurse Practitioner

## 2024-07-16 DIAGNOSIS — E039 Hypothyroidism, unspecified: Secondary | ICD-10-CM
